# Patient Record
Sex: Female | Born: 1940
Health system: Southern US, Community
[De-identification: ages and names within clinical notes are randomized; demographics above are authoritative.]

## PROBLEM LIST (undated history)

## (undated) DIAGNOSIS — M130 Polyarthritis, unspecified: Secondary | ICD-10-CM

## (undated) DIAGNOSIS — T7840XA Allergy, unspecified, initial encounter: Secondary | ICD-10-CM

## (undated) DIAGNOSIS — M76829 Posterior tibial tendinitis, unspecified leg: Secondary | ICD-10-CM

## (undated) DIAGNOSIS — Z86010 Personal history of colonic polyps: Secondary | ICD-10-CM

## (undated) DIAGNOSIS — R112 Nausea with vomiting, unspecified: Secondary | ICD-10-CM

## (undated) DIAGNOSIS — Z85820 Personal history of malignant melanoma of skin: Secondary | ICD-10-CM

## (undated) DIAGNOSIS — K52832 Lymphocytic colitis: Secondary | ICD-10-CM

## (undated) DIAGNOSIS — M8580 Other specified disorders of bone density and structure, unspecified site: Secondary | ICD-10-CM

## (undated) DIAGNOSIS — G43909 Migraine, unspecified, not intractable, without status migrainosus: Secondary | ICD-10-CM

## (undated) DIAGNOSIS — M5416 Radiculopathy, lumbar region: Secondary | ICD-10-CM

## (undated) DIAGNOSIS — L508 Other urticaria: Secondary | ICD-10-CM

## (undated) DIAGNOSIS — Z9889 Other specified postprocedural states: Secondary | ICD-10-CM

## (undated) DIAGNOSIS — E785 Hyperlipidemia, unspecified: Secondary | ICD-10-CM

## (undated) DIAGNOSIS — J302 Other seasonal allergic rhinitis: Secondary | ICD-10-CM

## (undated) DIAGNOSIS — F419 Anxiety disorder, unspecified: Secondary | ICD-10-CM

## (undated) DIAGNOSIS — R269 Unspecified abnormalities of gait and mobility: Secondary | ICD-10-CM

## (undated) DIAGNOSIS — M1990 Unspecified osteoarthritis, unspecified site: Secondary | ICD-10-CM

## (undated) DIAGNOSIS — K5790 Diverticulosis of intestine, part unspecified, without perforation or abscess without bleeding: Secondary | ICD-10-CM

## (undated) DIAGNOSIS — E669 Obesity, unspecified: Secondary | ICD-10-CM

## (undated) DIAGNOSIS — K760 Fatty (change of) liver, not elsewhere classified: Secondary | ICD-10-CM

## (undated) HISTORY — DX: Other urticaria: L50.8

## (undated) HISTORY — PX: FOOT SURGERY: SHX648

## (undated) HISTORY — DX: Radiculopathy, lumbar region: M54.16

## (undated) HISTORY — DX: Fatty (change of) liver, not elsewhere classified: K76.0

## (undated) HISTORY — DX: Allergy, unspecified, initial encounter: T78.40XA

## (undated) HISTORY — DX: Other specified disorders of bone density and structure, unspecified site: M85.80

## (undated) HISTORY — PX: JOINT REPLACEMENT: SHX530

## (undated) HISTORY — DX: Polyarthritis, unspecified: M13.0

## (undated) HISTORY — DX: Unspecified abnormalities of gait and mobility: R26.9

## (undated) HISTORY — DX: Migraine, unspecified, not intractable, without status migrainosus: G43.909

## (undated) HISTORY — DX: Unspecified osteoarthritis, unspecified site: M19.90

## (undated) HISTORY — DX: Personal history of malignant melanoma of skin: Z85.820

## (undated) HISTORY — DX: Lymphocytic colitis: K52.832

## (undated) HISTORY — DX: Diverticulosis of intestine, part unspecified, without perforation or abscess without bleeding: K57.90

## (undated) HISTORY — DX: Hyperlipidemia, unspecified: E78.5

## (undated) HISTORY — DX: Obesity, unspecified: E66.9

## (undated) HISTORY — DX: Personal history of colonic polyps: Z86.010

---

## 1999-02-25 ENCOUNTER — Ambulatory Visit (HOSPITAL_COMMUNITY): Admission: RE | Admit: 1999-02-25 | Discharge: 1999-02-25 | Payer: Self-pay | Admitting: Sports Medicine

## 1999-02-25 ENCOUNTER — Encounter: Payer: Self-pay | Admitting: Sports Medicine

## 1999-11-24 ENCOUNTER — Encounter: Payer: Self-pay | Admitting: Family Medicine

## 1999-11-24 ENCOUNTER — Encounter: Admission: RE | Admit: 1999-11-24 | Discharge: 1999-11-24 | Payer: Self-pay | Admitting: Family Medicine

## 1999-12-18 ENCOUNTER — Other Ambulatory Visit: Admission: RE | Admit: 1999-12-18 | Discharge: 1999-12-18 | Payer: Self-pay | Admitting: Family Medicine

## 2000-09-27 ENCOUNTER — Encounter: Admission: RE | Admit: 2000-09-27 | Discharge: 2000-09-27 | Payer: Self-pay | Admitting: Family Medicine

## 2000-09-27 ENCOUNTER — Encounter: Payer: Self-pay | Admitting: Family Medicine

## 2000-11-11 ENCOUNTER — Other Ambulatory Visit: Admission: RE | Admit: 2000-11-11 | Discharge: 2000-11-11 | Payer: Self-pay | Admitting: Family Medicine

## 2000-12-31 ENCOUNTER — Encounter: Admission: RE | Admit: 2000-12-31 | Discharge: 2000-12-31 | Payer: Self-pay | Admitting: Family Medicine

## 2000-12-31 ENCOUNTER — Encounter: Payer: Self-pay | Admitting: Family Medicine

## 2002-01-21 ENCOUNTER — Ambulatory Visit (HOSPITAL_COMMUNITY): Admission: RE | Admit: 2002-01-21 | Discharge: 2002-01-21 | Payer: Self-pay | Admitting: Family Medicine

## 2002-01-21 ENCOUNTER — Encounter: Payer: Self-pay | Admitting: Family Medicine

## 2003-07-26 ENCOUNTER — Other Ambulatory Visit: Admission: RE | Admit: 2003-07-26 | Discharge: 2003-07-26 | Payer: Self-pay | Admitting: Family Medicine

## 2003-08-23 ENCOUNTER — Ambulatory Visit (HOSPITAL_COMMUNITY): Admission: RE | Admit: 2003-08-23 | Discharge: 2003-08-23 | Payer: Self-pay | Admitting: Family Medicine

## 2004-08-01 ENCOUNTER — Ambulatory Visit: Payer: Self-pay | Admitting: Cardiology

## 2004-08-03 ENCOUNTER — Ambulatory Visit: Payer: Self-pay

## 2004-12-13 ENCOUNTER — Ambulatory Visit (HOSPITAL_COMMUNITY): Admission: RE | Admit: 2004-12-13 | Discharge: 2004-12-13 | Payer: Self-pay | Admitting: Family Medicine

## 2005-05-03 ENCOUNTER — Other Ambulatory Visit: Admission: RE | Admit: 2005-05-03 | Discharge: 2005-05-03 | Payer: Self-pay | Admitting: Family Medicine

## 2006-01-09 ENCOUNTER — Ambulatory Visit (HOSPITAL_COMMUNITY): Admission: RE | Admit: 2006-01-09 | Discharge: 2006-01-09 | Payer: Self-pay | Admitting: Family Medicine

## 2006-07-31 ENCOUNTER — Other Ambulatory Visit: Admission: RE | Admit: 2006-07-31 | Discharge: 2006-07-31 | Payer: Self-pay | Admitting: Family Medicine

## 2007-01-13 ENCOUNTER — Ambulatory Visit (HOSPITAL_COMMUNITY): Admission: RE | Admit: 2007-01-13 | Discharge: 2007-01-13 | Payer: Self-pay | Admitting: Family Medicine

## 2007-10-13 ENCOUNTER — Other Ambulatory Visit: Admission: RE | Admit: 2007-10-13 | Discharge: 2007-10-13 | Payer: Self-pay | Admitting: Family Medicine

## 2008-01-23 ENCOUNTER — Ambulatory Visit (HOSPITAL_COMMUNITY): Admission: RE | Admit: 2008-01-23 | Discharge: 2008-01-23 | Payer: Self-pay | Admitting: Family Medicine

## 2008-10-28 DIAGNOSIS — K5790 Diverticulosis of intestine, part unspecified, without perforation or abscess without bleeding: Secondary | ICD-10-CM | POA: Insufficient documentation

## 2008-10-28 HISTORY — PX: COLONOSCOPY: SHX174

## 2008-10-28 HISTORY — DX: Diverticulosis of intestine, part unspecified, without perforation or abscess without bleeding: K57.90

## 2009-03-11 ENCOUNTER — Ambulatory Visit (HOSPITAL_COMMUNITY): Admission: RE | Admit: 2009-03-11 | Discharge: 2009-03-11 | Payer: Self-pay | Admitting: Family Medicine

## 2010-03-15 ENCOUNTER — Ambulatory Visit (HOSPITAL_COMMUNITY)
Admission: RE | Admit: 2010-03-15 | Discharge: 2010-03-15 | Payer: Self-pay | Source: Home / Self Care | Attending: Family Medicine | Admitting: Family Medicine

## 2011-03-08 DIAGNOSIS — M1710 Unilateral primary osteoarthritis, unspecified knee: Secondary | ICD-10-CM | POA: Diagnosis not present

## 2011-03-08 DIAGNOSIS — IMO0002 Reserved for concepts with insufficient information to code with codable children: Secondary | ICD-10-CM | POA: Diagnosis not present

## 2011-03-13 DIAGNOSIS — IMO0002 Reserved for concepts with insufficient information to code with codable children: Secondary | ICD-10-CM | POA: Diagnosis not present

## 2011-03-13 DIAGNOSIS — M1710 Unilateral primary osteoarthritis, unspecified knee: Secondary | ICD-10-CM | POA: Diagnosis not present

## 2011-03-20 DIAGNOSIS — M2010 Hallux valgus (acquired), unspecified foot: Secondary | ICD-10-CM | POA: Diagnosis not present

## 2011-04-02 ENCOUNTER — Other Ambulatory Visit (HOSPITAL_COMMUNITY): Payer: Self-pay | Admitting: Family Medicine

## 2011-04-02 DIAGNOSIS — Z1231 Encounter for screening mammogram for malignant neoplasm of breast: Secondary | ICD-10-CM

## 2011-04-19 DIAGNOSIS — IMO0002 Reserved for concepts with insufficient information to code with codable children: Secondary | ICD-10-CM | POA: Diagnosis not present

## 2011-04-19 DIAGNOSIS — M1710 Unilateral primary osteoarthritis, unspecified knee: Secondary | ICD-10-CM | POA: Diagnosis not present

## 2011-04-27 ENCOUNTER — Ambulatory Visit (HOSPITAL_COMMUNITY)
Admission: RE | Admit: 2011-04-27 | Discharge: 2011-04-27 | Disposition: A | Discharge status: Home / Self Care | Payer: Medicare Other | Source: Ambulatory Visit | Attending: Family Medicine | Admitting: Family Medicine

## 2011-04-27 DIAGNOSIS — Z1231 Encounter for screening mammogram for malignant neoplasm of breast: Secondary | ICD-10-CM

## 2011-05-03 ENCOUNTER — Encounter (HOSPITAL_COMMUNITY): Payer: Self-pay | Admitting: Pharmacy Technician

## 2011-05-04 ENCOUNTER — Encounter (HOSPITAL_COMMUNITY)
Admission: RE | Admit: 2011-05-04 | Discharge: 2011-05-04 | Disposition: A | Discharge status: Home / Self Care | Payer: Medicare Other | Source: Ambulatory Visit | Attending: Orthopedic Surgery | Admitting: Orthopedic Surgery

## 2011-05-04 ENCOUNTER — Encounter (HOSPITAL_COMMUNITY): Payer: Self-pay

## 2011-05-04 HISTORY — DX: Other seasonal allergic rhinitis: J30.2

## 2011-05-04 LAB — URINALYSIS, ROUTINE W REFLEX MICROSCOPIC
Glucose, UA: NEGATIVE mg/dL
Hgb urine dipstick: NEGATIVE
Specific Gravity, Urine: 1.013 (ref 1.005–1.030)
Urobilinogen, UA: 0.2 mg/dL (ref 0.0–1.0)

## 2011-05-04 LAB — DIFFERENTIAL
Eosinophils Relative: 2 % (ref 0–5)
Lymphocytes Relative: 36 % (ref 12–46)
Lymphs Abs: 2.7 10*3/uL (ref 0.7–4.0)
Monocytes Absolute: 0.7 10*3/uL (ref 0.1–1.0)

## 2011-05-04 LAB — BASIC METABOLIC PANEL
CO2: 29 mEq/L (ref 19–32)
Chloride: 102 mEq/L (ref 96–112)
Sodium: 138 mEq/L (ref 135–145)

## 2011-05-04 LAB — CBC
HCT: 39.4 % (ref 36.0–46.0)
Hemoglobin: 13.1 g/dL (ref 12.0–15.0)
MCV: 91 fL (ref 78.0–100.0)
RBC: 4.33 MIL/uL (ref 3.87–5.11)
WBC: 7.5 10*3/uL (ref 4.0–10.5)

## 2011-05-04 LAB — APTT: aPTT: 32 seconds (ref 24–37)

## 2011-05-04 LAB — SURGICAL PCR SCREEN
MRSA, PCR: NEGATIVE
Staphylococcus aureus: NEGATIVE

## 2011-05-04 LAB — PROTIME-INR: Prothrombin Time: 12.2 seconds (ref 11.6–15.2)

## 2011-05-04 MED ORDER — CHLORHEXIDINE GLUCONATE 4 % EX LIQD: 60.0000 mL | Freq: Once | CUTANEOUS | Status: DC

## 2011-05-04 NOTE — Patient Instructions (Addendum)
21 Courtney Lawrence  05/04/2011   Your procedure is scheduled on:  05/14/11  Report to SHORT STAY DEPT  at 7:30 AM.  Call this number if you have problems the morning of surgery: [phone]   Remember:   Do not eat food or drink liquids AFTER MIDNIGHT  Take these medicines the morning of surgery with A SIP OF WATER: none   Do not wear jewelry, make-up or nail polish.  Do not wear lotions, powders, or perfumes.   Do not shave legs or underarms 48 hrs. before surgery (men may shave face)  Do not bring valuables to the hospital.  Contacts, dentures or bridgework may not be worn into surgery.  Leave suitcase in the car. After surgery it may be brought to your room.  For patients admitted to the hospital, checkout time is 11:00 AM the day of discharge.   Patients discharged the day of surgery will not be allowed to drive home.  Name and phone number of your driver:   Special Instructions:   Please read over the following fact sheets that you were given: MRSA  Information / Incentive Spirometer Instructions               SHOWER WITH BETASEPT THE NIGHT BEFORE SURGERY AND THE MORNING OF SURGERY

## 2011-05-10 NOTE — H&P (Signed)
Courtney Lawrence is an 71 y.o. female.    Chief Complaint: left knee OA and pain   HPI: Pt is a 71 y.o. female complaining of left knee pain for years off and on. Pain had continually increased since the beginning. The pain really increased after having surgery on her right foot, causing her to stress the left knee. X-rays in the clinic show end-stage arthritic changes of the left knee. Pt has tried various conservative treatments which have failed to alleviate their symptoms including NSAIDs and steroid injections. Various options are discussed with the patient. Risks, benefits and expectations were discussed with the patient. Patient understand the risks, benefits and expectations and wishes to proceed with surgery.   PCP:  Astrid Divine, MD, MD  D/C Plans:  Home with HHP  Post-op Meds:  Rx given for Xarelto, Robaxin, Iron, Colace and MiraLax. Pt is allergic to ASA and will take Xarelto for 14 days.  Tranexamic Acid:  To be given  Decadron:  To be given  PMH: Past Medical History  Diagnosis Date  . Seasonal allergies   . Headache     MIGRAINES    PSH: Past Surgical History  Procedure Date  . Foot surgery 2012 ALSO 2003    Social History:  reports that she has never smoked. She does not have any smokeless tobacco history on file. She reports that she drinks alcohol. She reports that she does not use illicit drugs.  Allergies:  Allergies  Allergen Reactions  . Aspirin Hives and Other (See Comments)    Headache   . Yellow Dyes (Non-Tartrazine) Hives  . Nickel Rash    GETS "INFECTION" WITH NICKEL EARRINGS    Medications: No current facility-administered medications for this encounter.   Current Outpatient Prescriptions  Medication Sig Dispense Refill  . CALCIUM-MAGNESIUM-VITAMIN D PO Take 1 tablet by mouth 2 (two) times daily.      . fish oil-omega-3 fatty acids 1000 MG capsule Take 1 g by mouth daily.      Marland Kitchen glucosamine-chondroitin 500-400 MG tablet Take 1  tablet by mouth 2 (two) times daily.      . Multiple Vitamins-Minerals (ICAPS PO) Take 1 tablet by mouth daily.      . SUMAtriptan (IMITREX) 100 MG tablet Take 50 mg by mouth every 2 (two) hours as needed. Headache        ROS: Review of Systems  Constitutional: Negative.   HENT: Negative.   Eyes: Negative.   Respiratory: Negative.   Cardiovascular: Negative.   Gastrointestinal: Negative.   Genitourinary: Negative.   Musculoskeletal: Positive for joint pain.  Skin: Negative.   Neurological: Negative.   Endo/Heme/Allergies: Positive for environmental allergies.  Psychiatric/Behavioral: Negative.      Physican Exam: BP: 126/73 ; HR: 80 ; Resp: 16 Physical Exam  Constitutional: She is oriented to person, place, and time and well-developed, well-nourished, and in no distress.  HENT:  Head: Normocephalic and atraumatic.  Nose: Nose normal.  Mouth/Throat: Oropharynx is clear and moist.  Eyes: Pupils are equal, round, and reactive to light.  Neck: Neck supple. No JVD present. No tracheal deviation present. No thyromegaly present.  Cardiovascular: Normal rate, regular rhythm, normal heart sounds and intact distal pulses.   Pulmonary/Chest: Effort normal and breath sounds normal. No stridor.  Abdominal: Soft. There is no tenderness. There is no guarding.  Musculoskeletal:       Left knee: She exhibits decreased range of motion (5-120) and bony tenderness. She exhibits no swelling, no effusion and  no ecchymosis. tenderness found.  Lymphadenopathy:    She has no cervical adenopathy.  Neurological: She is alert and oriented to person, place, and time.  Skin: Skin is warm and dry.  Psychiatric: Affect normal.     Assessment/Plan Assessment: left knee OA and pain   Plan: Patient will undergo a left total knee arthroplasty on 05/14/2011 at [facility] per Dr. Charlann Boxer. Risks benefits and expectation were discussed with the patient. Patient understand risks, benefits and  expectation and wishes to proceed.   Anastasio Auerbach Abdishakur Gottschall   PAC  05/10/2011, 3:57 PM

## 2011-05-13 MED ORDER — TRANEXAMIC ACID 100 MG/ML IV SOLN
1100.0000 mg | Freq: Once | INTRAVENOUS | Status: DC
  Filled 2011-05-13: qty 11

## 2011-05-14 ENCOUNTER — Encounter (HOSPITAL_COMMUNITY): Payer: Self-pay | Admitting: Anesthesiology

## 2011-05-14 ENCOUNTER — Encounter (HOSPITAL_COMMUNITY): Payer: Self-pay | Admitting: *Deleted

## 2011-05-14 ENCOUNTER — Inpatient Hospital Stay (HOSPITAL_COMMUNITY)
Admission: RE | Admit: 2011-05-14 | Discharge: 2011-05-17 | DRG: 470 | Disposition: A | Discharge status: Skilled Nursing Facility | Payer: Medicare Other | Source: Ambulatory Visit | Attending: Orthopedic Surgery | Admitting: Orthopedic Surgery

## 2011-05-14 ENCOUNTER — Ambulatory Visit (HOSPITAL_COMMUNITY): Payer: Medicare Other | Admitting: Anesthesiology

## 2011-05-14 ENCOUNTER — Encounter (HOSPITAL_COMMUNITY)
Admission: RE | Disposition: A | Discharge status: Skilled Nursing Facility | Payer: Self-pay | Source: Ambulatory Visit | Attending: Orthopedic Surgery

## 2011-05-14 DIAGNOSIS — M1710 Unilateral primary osteoarthritis, unspecified knee: Secondary | ICD-10-CM | POA: Diagnosis not present

## 2011-05-14 DIAGNOSIS — Z01812 Encounter for preprocedural laboratory examination: Secondary | ICD-10-CM | POA: Diagnosis not present

## 2011-05-14 DIAGNOSIS — D649 Anemia, unspecified: Secondary | ICD-10-CM | POA: Diagnosis not present

## 2011-05-14 DIAGNOSIS — M25569 Pain in unspecified knee: Secondary | ICD-10-CM | POA: Diagnosis not present

## 2011-05-14 DIAGNOSIS — K5900 Constipation, unspecified: Secondary | ICD-10-CM | POA: Diagnosis not present

## 2011-05-14 DIAGNOSIS — Z96659 Presence of unspecified artificial knee joint: Secondary | ICD-10-CM | POA: Diagnosis not present

## 2011-05-14 DIAGNOSIS — M1990 Unspecified osteoarthritis, unspecified site: Secondary | ICD-10-CM | POA: Diagnosis not present

## 2011-05-14 DIAGNOSIS — Z5189 Encounter for other specified aftercare: Secondary | ICD-10-CM | POA: Diagnosis not present

## 2011-05-14 DIAGNOSIS — IMO0002 Reserved for concepts with insufficient information to code with codable children: Secondary | ICD-10-CM | POA: Diagnosis not present

## 2011-05-14 DIAGNOSIS — M129 Arthropathy, unspecified: Secondary | ICD-10-CM | POA: Diagnosis not present

## 2011-05-14 HISTORY — PX: TOTAL KNEE ARTHROPLASTY: SHX125

## 2011-05-14 LAB — TYPE AND SCREEN
ABO/RH(D): O POS
Antibody Screen: NEGATIVE

## 2011-05-14 SURGERY — ARTHROPLASTY, KNEE, TOTAL
Anesthesia: Spinal | Site: Knee | Laterality: Left | Wound class: Clean

## 2011-05-14 MED ORDER — METOCLOPRAMIDE HCL 5 MG/ML IJ SOLN
5.0000 mg | Freq: Three times a day (TID) | INTRAMUSCULAR | Status: DC | PRN
  Administered 2011-05-14 – 2011-05-16 (×2): 10 mg via INTRAVENOUS
  Filled 2011-05-14 (×2): qty 2

## 2011-05-14 MED ORDER — DEXAMETHASONE SODIUM PHOSPHATE 10 MG/ML IJ SOLN
10.0000 mg | Freq: Once | INTRAMUSCULAR | Status: AC
  Administered 2011-05-15: 10 mg via INTRAVENOUS
  Filled 2011-05-14: qty 1

## 2011-05-14 MED ORDER — CEFAZOLIN SODIUM 1-5 GM-% IV SOLN
1.0000 g | INTRAVENOUS | Status: AC
  Administered 2011-05-14: 1 g via INTRAVENOUS

## 2011-05-14 MED ORDER — DIPHENHYDRAMINE HCL 25 MG PO CAPS: 25.0000 mg | ORAL_CAPSULE | Freq: Four times a day (QID) | ORAL | Status: DC | PRN

## 2011-05-14 MED ORDER — ACETAMINOPHEN 650 MG RE SUPP: 650.0000 mg | Freq: Four times a day (QID) | RECTAL | Status: DC | PRN

## 2011-05-14 MED ORDER — HYDROMORPHONE HCL PF 1 MG/ML IJ SOLN
0.2500 mg | INTRAMUSCULAR | Status: DC | PRN
  Administered 2011-05-14 (×2): 0.5 mg via INTRAVENOUS

## 2011-05-14 MED ORDER — METHOCARBAMOL 500 MG PO TABS
500.0000 mg | ORAL_TABLET | Freq: Four times a day (QID) | ORAL | Status: DC | PRN
  Administered 2011-05-15 – 2011-05-17 (×5): 500 mg via ORAL
  Filled 2011-05-14 (×5): qty 1

## 2011-05-14 MED ORDER — DEXAMETHASONE SODIUM PHOSPHATE 10 MG/ML IJ SOLN
INTRAMUSCULAR | Status: DC | PRN
  Administered 2011-05-14: 10 mg via INTRAVENOUS

## 2011-05-14 MED ORDER — KETOROLAC TROMETHAMINE 30 MG/ML IJ SOLN
INTRAMUSCULAR | Status: AC
  Filled 2011-05-14: qty 1

## 2011-05-14 MED ORDER — ALUM & MAG HYDROXIDE-SIMETH 200-200-20 MG/5ML PO SUSP: 30.0000 mL | ORAL | Status: DC | PRN

## 2011-05-14 MED ORDER — HYDROMORPHONE HCL PF 1 MG/ML IJ SOLN
0.5000 mg | INTRAMUSCULAR | Status: DC | PRN
  Administered 2011-05-14: 1 mg via INTRAVENOUS
  Administered 2011-05-14 – 2011-05-15 (×3): 2 mg via INTRAVENOUS
  Filled 2011-05-14 (×3): qty 2

## 2011-05-14 MED ORDER — 0.9 % SODIUM CHLORIDE (POUR BTL) OPTIME
TOPICAL | Status: DC | PRN
  Administered 2011-05-14: 1000 mL

## 2011-05-14 MED ORDER — ACETAMINOPHEN 325 MG PO TABS: 650.0000 mg | ORAL_TABLET | Freq: Four times a day (QID) | ORAL | Status: DC | PRN

## 2011-05-14 MED ORDER — DEXAMETHASONE SODIUM PHOSPHATE 10 MG/ML IJ SOLN: 10.0000 mg | Freq: Once | INTRAMUSCULAR | Status: DC

## 2011-05-14 MED ORDER — PROPOFOL 10 MG/ML IV EMUL
INTRAVENOUS | Status: DC | PRN
  Administered 2011-05-14: 120 ug/kg/min via INTRAVENOUS

## 2011-05-14 MED ORDER — BUPIVACAINE IN DEXTROSE 0.75-8.25 % IT SOLN
INTRATHECAL | Status: DC | PRN
  Administered 2011-05-14: 10 mg via INTRATHECAL

## 2011-05-14 MED ORDER — BUPIVACAINE-EPINEPHRINE PF 0.25-1:200000 % IJ SOLN
INTRAMUSCULAR | Status: AC
  Filled 2011-05-14: qty 60

## 2011-05-14 MED ORDER — ZOLPIDEM TARTRATE 5 MG PO TABS: 5.0000 mg | ORAL_TABLET | Freq: Every evening | ORAL | Status: DC | PRN

## 2011-05-14 MED ORDER — DOCUSATE SODIUM 100 MG PO CAPS: 100.0000 mg | ORAL_CAPSULE | Freq: Two times a day (BID) | ORAL | Status: DC

## 2011-05-14 MED ORDER — RIVAROXABAN 10 MG PO TABS
10.0000 mg | ORAL_TABLET | ORAL | Status: DC
  Administered 2011-05-15 – 2011-05-17 (×3): 10 mg via ORAL
  Filled 2011-05-14 (×4): qty 1

## 2011-05-14 MED ORDER — FLEET ENEMA 7-19 GM/118ML RE ENEM: 1.0000 | ENEMA | Freq: Once | RECTAL | Status: AC | PRN

## 2011-05-14 MED ORDER — MIDAZOLAM HCL 5 MG/5ML IJ SOLN
INTRAMUSCULAR | Status: DC | PRN
  Administered 2011-05-14: 2 mg via INTRAVENOUS
  Administered 2011-05-14 (×2): 1 mg via INTRAVENOUS

## 2011-05-14 MED ORDER — TRANEXAMIC ACID 100 MG/ML IV SOLN
1100.0000 mg | INTRAVENOUS | Status: DC | PRN
  Administered 2011-05-14: 1100 mg via INTRAVENOUS

## 2011-05-14 MED ORDER — ACETAMINOPHEN 10 MG/ML IV SOLN
INTRAVENOUS | Status: AC
  Filled 2011-05-14: qty 100

## 2011-05-14 MED ORDER — HYDROCODONE-ACETAMINOPHEN 7.5-325 MG PO TABS
1.0000 | ORAL_TABLET | ORAL | Status: DC
  Administered 2011-05-14 – 2011-05-15 (×6): 2 via ORAL
  Administered 2011-05-16: 1 via ORAL
  Administered 2011-05-16 (×3): 2 via ORAL
  Administered 2011-05-16 – 2011-05-17 (×4): 1 via ORAL
  Filled 2011-05-14: qty 2
  Filled 2011-05-14: qty 1
  Filled 2011-05-14: qty 2
  Filled 2011-05-14: qty 1
  Filled 2011-05-14 (×3): qty 2
  Filled 2011-05-14: qty 1
  Filled 2011-05-14: qty 2
  Filled 2011-05-14: qty 1
  Filled 2011-05-14: qty 2
  Filled 2011-05-14: qty 1
  Filled 2011-05-14 (×3): qty 2

## 2011-05-14 MED ORDER — POLYETHYLENE GLYCOL 3350 17 G PO PACK
17.0000 g | PACK | Freq: Two times a day (BID) | ORAL | Status: DC
  Administered 2011-05-15 – 2011-05-17 (×4): 17 g via ORAL
  Filled 2011-05-14 (×5): qty 1

## 2011-05-14 MED ORDER — SODIUM CHLORIDE 0.9 % IV SOLN
INTRAVENOUS | Status: DC
  Administered 2011-05-14 – 2011-05-17 (×5): via INTRAVENOUS
  Filled 2011-05-14 (×13): qty 1000

## 2011-05-14 MED ORDER — MENTHOL 3 MG MT LOZG: 1.0000 | LOZENGE | OROMUCOSAL | Status: DC | PRN

## 2011-05-14 MED ORDER — SUMATRIPTAN SUCCINATE 50 MG PO TABS
50.0000 mg | ORAL_TABLET | ORAL | Status: DC | PRN
  Filled 2011-05-14 (×3): qty 1

## 2011-05-14 MED ORDER — LACTATED RINGERS IV SOLN
INTRAVENOUS | Status: DC
  Administered 2011-05-14: 1000 mL via INTRAVENOUS

## 2011-05-14 MED ORDER — FERROUS SULFATE 325 (65 FE) MG PO TABS
325.0000 mg | ORAL_TABLET | Freq: Three times a day (TID) | ORAL | Status: DC
  Administered 2011-05-15 – 2011-05-17 (×8): 325 mg via ORAL
  Filled 2011-05-14 (×7): qty 1

## 2011-05-14 MED ORDER — LACTATED RINGERS IV SOLN
INTRAVENOUS | Status: DC | PRN
  Administered 2011-05-14 (×3): via INTRAVENOUS

## 2011-05-14 MED ORDER — ONDANSETRON HCL 4 MG PO TABS
4.0000 mg | ORAL_TABLET | Freq: Four times a day (QID) | ORAL | Status: DC | PRN
  Administered 2011-05-16 (×2): 4 mg via ORAL
  Filled 2011-05-14 (×2): qty 1

## 2011-05-14 MED ORDER — BISACODYL 5 MG PO TBEC: 5.0000 mg | DELAYED_RELEASE_TABLET | Freq: Every day | ORAL | Status: DC | PRN

## 2011-05-14 MED ORDER — PHENOL 1.4 % MT LIQD: 1.0000 | OROMUCOSAL | Status: DC | PRN

## 2011-05-14 MED ORDER — SODIUM CHLORIDE 0.9 % IR SOLN
Status: DC | PRN
  Administered 2011-05-14: 3000 mL

## 2011-05-14 MED ORDER — HYDROMORPHONE HCL PF 1 MG/ML IJ SOLN
INTRAMUSCULAR | Status: AC
  Administered 2011-05-15: 1 mg via INTRAVENOUS
  Filled 2011-05-14: qty 1

## 2011-05-14 MED ORDER — HYDROMORPHONE HCL PF 1 MG/ML IJ SOLN
INTRAMUSCULAR | Status: AC
  Filled 2011-05-14: qty 1

## 2011-05-14 MED ORDER — ONDANSETRON HCL 4 MG/2ML IJ SOLN
4.0000 mg | Freq: Four times a day (QID) | INTRAMUSCULAR | Status: DC | PRN
  Administered 2011-05-14 – 2011-05-15 (×5): 4 mg via INTRAVENOUS
  Filled 2011-05-14 (×5): qty 2

## 2011-05-14 MED ORDER — ONDANSETRON HCL 4 MG/2ML IJ SOLN
INTRAMUSCULAR | Status: DC | PRN
  Administered 2011-05-14 (×2): 2 mg via INTRAVENOUS

## 2011-05-14 MED ORDER — CEFAZOLIN SODIUM 1-5 GM-% IV SOLN
1.0000 g | Freq: Four times a day (QID) | INTRAVENOUS | Status: AC
  Administered 2011-05-15 (×2): 1 g via INTRAVENOUS
  Filled 2011-05-14 (×3): qty 50

## 2011-05-14 MED ORDER — ACETAMINOPHEN 10 MG/ML IV SOLN
INTRAVENOUS | Status: DC | PRN
  Administered 2011-05-14: 1000 mg via INTRAVENOUS

## 2011-05-14 MED ORDER — CEFAZOLIN SODIUM 1-5 GM-% IV SOLN
INTRAVENOUS | Status: AC
  Filled 2011-05-14: qty 50

## 2011-05-14 MED ORDER — METHOCARBAMOL 100 MG/ML IJ SOLN
500.0000 mg | Freq: Four times a day (QID) | INTRAVENOUS | Status: DC | PRN
  Administered 2011-05-14: 500 mg via INTRAVENOUS
  Filled 2011-05-14 (×2): qty 5

## 2011-05-14 MED ORDER — BUPIVACAINE-EPINEPHRINE PF 0.25-1:200000 % IJ SOLN
INTRAMUSCULAR | Status: DC | PRN
  Administered 2011-05-14: 60 mL

## 2011-05-14 MED ORDER — METOCLOPRAMIDE HCL 10 MG PO TABS
5.0000 mg | ORAL_TABLET | Freq: Three times a day (TID) | ORAL | Status: DC | PRN
  Administered 2011-05-16: 10 mg via ORAL
  Filled 2011-05-14: qty 1

## 2011-05-14 SURGICAL SUPPLY — 58 items
ADH SKN CLS APL DERMABOND .7 (GAUZE/BANDAGES/DRESSINGS) ×1
BAG SPEC THK2 15X12 ZIP CLS (MISCELLANEOUS) ×1
BAG ZIPLOCK 12X15 (MISCELLANEOUS) ×2 IMPLANT
BANDAGE ELASTIC 6 VELCRO ST LF (GAUZE/BANDAGES/DRESSINGS) ×2 IMPLANT
BANDAGE ESMARK 6X9 LF (GAUZE/BANDAGES/DRESSINGS) ×1 IMPLANT
BLADE SAW SGTL 13.0X1.19X90.0M (BLADE) ×2 IMPLANT
BNDG CMPR 9X6 STRL LF SNTH (GAUZE/BANDAGES/DRESSINGS) ×1
BNDG ESMARK 6X9 LF (GAUZE/BANDAGES/DRESSINGS) ×2
BOWL SMART MIX CTS (DISPOSABLE) ×2 IMPLANT
CEMENT HV SMART SET (Cement) ×2 IMPLANT
CLOTH BEACON ORANGE TIMEOUT ST (SAFETY) ×2 IMPLANT
CUFF TOURN SGL QUICK 34 (TOURNIQUET CUFF) ×2
CUFF TRNQT CYL 34X4X40X1 (TOURNIQUET CUFF) ×1 IMPLANT
DECANTER SPIKE VIAL GLASS SM (MISCELLANEOUS) ×1 IMPLANT
DERMABOND ADVANCED (GAUZE/BANDAGES/DRESSINGS) ×1
DERMABOND ADVANCED .7 DNX12 (GAUZE/BANDAGES/DRESSINGS) ×1 IMPLANT
DRAPE EXTREMITY T 121X128X90 (DRAPE) ×2 IMPLANT
DRAPE POUCH INSTRU U-SHP 10X18 (DRAPES) ×2 IMPLANT
DRAPE U-SHAPE 47X51 STRL (DRAPES) ×2 IMPLANT
DRSG AQUACEL AG ADV 3.5X10 (GAUZE/BANDAGES/DRESSINGS) ×2 IMPLANT
DRSG TEGADERM 4X4.75 (GAUZE/BANDAGES/DRESSINGS) ×2 IMPLANT
DURAPREP 26ML APPLICATOR (WOUND CARE) ×2 IMPLANT
ELECT REM PT RETURN 9FT ADLT (ELECTROSURGICAL) ×2
ELECTRODE REM PT RTRN 9FT ADLT (ELECTROSURGICAL) ×1 IMPLANT
EVACUATOR 1/8 PVC DRAIN (DRAIN) ×2 IMPLANT
FACESHIELD LNG OPTICON STERILE (SAFETY) ×10 IMPLANT
GAUZE SPONGE 2X2 8PLY STRL LF (GAUZE/BANDAGES/DRESSINGS) ×1 IMPLANT
GLOVE BIOGEL PI IND STRL 7.5 (GLOVE) ×1 IMPLANT
GLOVE BIOGEL PI IND STRL 8 (GLOVE) ×1 IMPLANT
GLOVE BIOGEL PI INDICATOR 7.5 (GLOVE) ×1
GLOVE BIOGEL PI INDICATOR 8 (GLOVE) ×1
GLOVE ECLIPSE 8.0 STRL XLNG CF (GLOVE) ×2 IMPLANT
GLOVE ORTHO TXT STRL SZ7.5 (GLOVE) ×4 IMPLANT
GOWN BRE IMP PREV XXLGXLNG (GOWN DISPOSABLE) ×2 IMPLANT
GOWN STRL NON-REIN LRG LVL3 (GOWN DISPOSABLE) ×2 IMPLANT
HANDPIECE INTERPULSE COAX TIP (DISPOSABLE) ×2
IMMOBILIZER KNEE 20 (SOFTGOODS) ×2
IMMOBILIZER KNEE 20 THIGH 36 (SOFTGOODS) IMPLANT
KIT BASIN OR (CUSTOM PROCEDURE TRAY) ×2 IMPLANT
MANIFOLD NEPTUNE II (INSTRUMENTS) ×2 IMPLANT
NDL SAFETY ECLIPSE 18X1.5 (NEEDLE) ×1 IMPLANT
NEEDLE HYPO 18GX1.5 SHARP (NEEDLE) ×2
NS IRRIG 1000ML POUR BTL (IV SOLUTION) ×4 IMPLANT
PACK TOTAL JOINT (CUSTOM PROCEDURE TRAY) ×2 IMPLANT
POSITIONER SURGICAL ARM (MISCELLANEOUS) ×2 IMPLANT
SET HNDPC FAN SPRY TIP SCT (DISPOSABLE) ×1 IMPLANT
SET PAD KNEE POSITIONER (MISCELLANEOUS) ×2 IMPLANT
SPONGE GAUZE 2X2 STER 10/PKG (GAUZE/BANDAGES/DRESSINGS) ×1
SUCTION FRAZIER 12FR DISP (SUCTIONS) ×2 IMPLANT
SUT MNCRL AB 4-0 PS2 18 (SUTURE) ×2 IMPLANT
SUT VIC AB 1 CT1 36 (SUTURE) ×5 IMPLANT
SUT VIC AB 2-0 CT1 27 (SUTURE) ×4
SUT VIC AB 2-0 CT1 TAPERPNT 27 (SUTURE) ×3 IMPLANT
SYR 50ML LL SCALE MARK (SYRINGE) ×2 IMPLANT
TOWEL OR 17X26 10 PK STRL BLUE (TOWEL DISPOSABLE) ×4 IMPLANT
TRAY FOLEY CATH 14FRSI W/METER (CATHETERS) ×2 IMPLANT
WATER STERILE IRR 1500ML POUR (IV SOLUTION) ×2 IMPLANT
WRAP KNEE MAXI GEL POST OP (GAUZE/BANDAGES/DRESSINGS) ×2 IMPLANT

## 2011-05-14 NOTE — Anesthesia Postprocedure Evaluation (Signed)
  Anesthesia Post-op Note  Patient: Courtney Lawrence  Procedure(s) Performed: Procedure(s) (LRB): TOTAL KNEE ARTHROPLASTY (Left)  Patient Location: PACU  Anesthesia Type: Spinal  Level of Consciousness: oriented and sedated  Airway and Oxygen Therapy: Patient Spontanous Breathing and Patient connected to nasal cannula oxygen  Post-op Pain: mild  Post-op Assessment: Post-op Vital signs reviewed, Patient's Cardiovascular Status Stable, Respiratory Function Stable and Patent Airway  Post-op Vital Signs: stable  Complications: No apparent anesthesia complications

## 2011-05-14 NOTE — Op Note (Signed)
NAME:  Courtney Lawrence                      MEDICAL RECORD NO.:  [id number]                             FACILITY:  [facility]      PHYSICIAN:  Madlyn Frankel. Charlann Boxer, M.D.  DATE OF BIRTH:  [birth year]      DATE OF PROCEDURE:  05/14/2011                                     OPERATIVE REPORT         PREOPERATIVE DIAGNOSIS:  Left knee osteoarthritis. With Nickel allergy, known     POSTOPERATIVE DIAGNOSIS:  Left knee osteoarthritis. With known Nickel allergy     FINDINGS:  The patient was noted to have complete loss of cartilage and   bone-on-bone arthritis with associated osteophytes in the medial and patellofemoral compartments of   the knee with a significant synovitis and associated effusion.      PROCEDURE:  Left total knee replacement.      COMPONENTS USED:  Smith and Nephew posterior stabilized knee   system, a size 5 Oxinium femur, 3 titanium baseplate tibia, 11mm high flex insert, and 35/7.32mm patellar   button.      SURGEON:  Madlyn Frankel. Charlann Boxer, M.D.      ASSISTANT:  Lanney Gins, PA-C.      ANESTHESIA:  Spinal.      SPECIMENS:  None.      COMPLICATION:  None.      DRAINS:  One Hemovac.  EBL: <100cc      TOURNIQUET TIME:   Total Tourniquet Time Documented: Thigh (Left) - 48 minutes .      The patient was stable to the recovery room.      INDICATION FOR PROCEDURE:  Courtney Lawrence is a 71 y.o. female patient of   mine.  The patient had been seen, evaluated, and treated conservatively in the   office with medication, activity modification, and injections.  The patient had   radiographic changes of bone-on-bone arthritis with endplate sclerosis and osteophytes noted.      The patient failed conservative measures including medication, injections, and activity modification, and at this point was ready for more definitive measures.   Based on the radiographic changes and failed conservative measures, the patient   decided to proceed with total knee replacement.  Risks of infection,     DVT, component failure, need for revision surgery, postop course, and   expectations were all   discussed and reviewed.  Consent was obtained for benefit of pain   relief.      PROCEDURE IN DETAIL:  The patient was brought to the operative theater.   Once adequate anesthesia, preoperative antibiotics, 1 gm of Ancef administered, the patient was positioned supine with the left thigh tourniquet placed.  The  left lower extremity was prepped and draped in sterile fashion.  A time-   out was performed identifying the patient, planned procedure, and   extremity.      The left lower extremity was placed in the [facility] leg holder.  The leg was   exsanguinated, tourniquet elevated to 250 mmHg.  A midline incision was   made followed by median parapatellar arthrotomy.  Following initial  exposure, attention was first directed to the patella.  Precut   measurement was noted to be 23 mm.  I resected down to 14 mm and used a   35/7.53mm patellar button to restore patellar height as well as cover the cut   surface.      The lug holes were drilled and a metal shim was placed to protect the   patella from retractors and saw blades.      At this point, attention was now directed to the femur.  The femoral   canal was opened with a drill, irrigated to try to prevent fat emboli.  An   intramedullary rod was passed at 5 degrees valgus, 9.5 mm of bone was   resected off the distal femur.  Following this resection, the tibia was   subluxated anteriorly.  Using the extramedullary guide, 9 mm of bone was resected off   the proximal lateral tibia.  We confirmed the gap would be   stable medially and laterally with a 10 mm insert as well as confirmed   the cut was perpendicular in the coronal plane, checking with an alignment rod.      Once this was done, I sized the femur to be a size 5 in the anterior-   posterior dimension.  The   anterior, posterior, and  chamfer cuts were made without difficulty nor    notching making certain that I was along the anterior cortex to help   with flexion gap stability.      The final box cut was made off the lateral aspect of distal femur.      At this point, the tibia was sized to be a size 3, the size 3 tray was   then pinned in position through the medial third of the tubercle,   drilled, and keel punched.  Trial reduction was now carried with a 5 femur,  3 tibia, a 11 mm insert, and the 35 patella botton.  The knee was brought to   extension, full extension with good flexion stability with the patella   tracking through the trochlea without application of pressure.  Given   all these findings, the trial components removed.  Final components were   opened and cement was mixed.  The knee was irrigated with normal saline   solution and pulse lavage.  The synovial lining was   then injected with 0.25% Marcaine with epinephrine and 1 cc of Toradol,   total of 61 cc.      The knee was irrigated.  Final implants were then cemented onto clean and   dried cut surfaces of bone with the knee brought to extension with a 11   mm trial insert.      Once the cement had fully cured, the excess cement was removed   throughout the knee.  I confirmed I was satisfied with the range of   motion and stability, and the final 11mm high flex posterior stabilized insert was chosen.  It was   placed into the knee.      The tourniquet had been let down at 48 minutes.  No significant   hemostasis required.  The medium Hemovac drain was placed deep.  The   extensor mechanism was then reapproximated using #1 Vicryl with the knee   in flexion.  The   remaining wound was closed with 2-0 Vicryl and running 4-0 Monocryl.   The knee was cleaned, dried, dressed sterilely using Dermabond and  Aquacel dressing.  Drain site dressed separately.  The patient was then   brought to recovery room in stable condition, tolerating the procedure   well.   Please note that Physician  Assistant, Lanney Gins, was present for the entirety of the case, and was utilized for pre-operative positioning, peri-operative retractor management, general facilitation of the procedure.  He was also utilized for primary wound closure at the end of the case.              Madlyn Frankel Charlann Boxer, M.D.

## 2011-05-14 NOTE — Anesthesia Procedure Notes (Signed)
Spinal  Patient location during procedure: OR Start time: 05/14/2011 10:39 AM End time: 05/14/2011 10:49 AM Staffing Anesthesiologist: Lestine Box B Performed by: anesthesiologist  Preanesthetic Checklist Completed: patient identified, site marked, surgical consent, pre-op evaluation, timeout performed, IV checked, risks and benefits discussed and monitors and equipment checked Spinal Block Patient position: sitting Prep: Betadine and site prepped and draped Patient monitoring: blood pressure, continuous pulse ox, cardiac monitor and heart rate Approach: right paramedian Location: L3-4 Injection technique: single-shot Needle Needle type: Quincke  Needle gauge: 25 G Needle length: 10 cm Needle insertion depth: 3 cm Assessment Sensory level: T6 Additional Notes [id number], 2014-06

## 2011-05-14 NOTE — Anesthesia Preprocedure Evaluation (Signed)
Anesthesia Evaluation  Patient identified by MRN, date of birth, ID band Patient awake    Reviewed: Allergy & Precautions, H&P , NPO status , Patient's Chart, lab work & pertinent test results, reviewed documented beta blocker date and time   Airway Mallampati: II TM Distance: >3 FB Neck ROM: Full    Dental   Pulmonary neg pulmonary ROS,  breath sounds clear to auscultation        Cardiovascular negative cardio ROS  Rhythm:Regular Rate:Normal  Denies cardiac symptoms   Neuro/Psych negative neurological ROS  negative psych ROS   GI/Hepatic negative GI ROS, Neg liver ROS,   Endo/Other  negative endocrine ROS  Renal/GU negative Renal ROS  negative genitourinary   Musculoskeletal negative musculoskeletal ROS (+)   Abdominal   Peds negative pediatric ROS (+)  Hematology negative hematology ROS (+)   Anesthesia Other Findings   Reproductive/Obstetrics negative OB ROS                           Anesthesia Physical Anesthesia Plan  ASA: I  Anesthesia Plan: Spinal   Post-op Pain Management:    Induction: Intravenous  Airway Management Planned: Mask  Additional Equipment:   Intra-op Plan:   Post-operative Plan:   Informed Consent:   Plan Discussed with: CRNA and Surgeon  Anesthesia Plan Comments:         Anesthesia Quick Evaluation

## 2011-05-14 NOTE — Preoperative (Signed)
Beta Blockers   Reason not to administer Beta Blockers:Not Applicable 

## 2011-05-14 NOTE — Interval H&P Note (Signed)
History and Physical Interval Note:  05/14/2011 10:03 AM  Courtney Lawrence  has presented today for surgery, with the diagnosis of Osteoarthritis of the Left Knee  The various methods of treatment have been discussed with the patient and family. After consideration of risks, benefits and other options for treatment, the patient has consented to  Procedure(s) (LRB): LEFT TOTAL KNEE ARTHROPLASTY (Left) as a surgical intervention .  The patients' history has been reviewed, patient examined, no change in status, stable for surgery.  I have reviewed the patients' chart and labs.  Questions were answered to the patient's satisfaction.     Shelda Pal

## 2011-05-14 NOTE — Transfer of Care (Signed)
Immediate Anesthesia Transfer of Care Note  Patient: Courtney Lawrence  Procedure(s) Performed: Procedure(s) (LRB): TOTAL KNEE ARTHROPLASTY (Left)  Patient Location: PACU  Anesthesia Type: Spinal  Level of Consciousness: awake, alert , oriented and patient cooperative  Airway & Oxygen Therapy: Patient Spontanous Breathing and Patient connected to face mask oxygen  Post-op Assessment: Report given to PACU RN and Post -op Vital signs reviewed and stable  Post vital signs: Reviewed and stable  Complications: No apparent anesthesia complications

## 2011-05-15 LAB — BASIC METABOLIC PANEL WITH GFR
BUN: 19 mg/dL (ref 6–23)
CO2: 27 meq/L (ref 19–32)
Calcium: 8.7 mg/dL (ref 8.4–10.5)
Chloride: 103 meq/L (ref 96–112)
Creatinine, Ser: 0.69 mg/dL (ref 0.50–1.10)
GFR calc Af Amer: >90 mL/min
GFR calc non Af Amer: 86 mL/min — ABNORMAL LOW
Glucose, Bld: 177 mg/dL — ABNORMAL HIGH (ref 70–99)
Potassium: 4.5 meq/L (ref 3.5–5.1)
Sodium: 138 meq/L (ref 135–145)

## 2011-05-15 LAB — CBC
Hemoglobin: 11.3 g/dL — ABNORMAL LOW (ref 12.0–15.0)
MCHC: 33.1 g/dL (ref 30.0–36.0)
Platelets: 178 10*3/uL (ref 150–400)

## 2011-05-15 MED ORDER — HYDROMORPHONE HCL PF 1 MG/ML IJ SOLN
0.5000 mg | INTRAMUSCULAR | Status: DC | PRN
  Administered 2011-05-15 (×2): 1 mg via INTRAVENOUS
  Filled 2011-05-15 (×2): qty 1

## 2011-05-15 MED ORDER — KETOROLAC TROMETHAMINE 15 MG/ML IJ SOLN
15.0000 mg | Freq: Four times a day (QID) | INTRAMUSCULAR | Status: DC
  Administered 2011-05-15 – 2011-05-17 (×7): 15 mg via INTRAVENOUS
  Filled 2011-05-15 (×8): qty 1

## 2011-05-15 NOTE — Progress Notes (Signed)
Subjective: 1 Day Post-Op Procedure(s) (LRB): TOTAL KNEE ARTHROPLASTY (Left)   Patient reports pain as mild. Difficulty in staying awake, feeling very drowsy. No other events.  Objective:   VITALS:   Filed Vitals:   05/15/11 0931  BP: 123/75  Pulse: 76  Temp: 97.4 F (36.3 C)  Resp: 16    Neurovascular intact Dorsiflexion/Plantar flexion intact Incision: dressing C/D/I No cellulitis present Compartment soft  LABS  Basename 05/15/11 0410  HGB 11.3*  HCT 34.1*  WBC 11.5*  PLT 178     Basename 05/15/11 0410  NA 138  K 4.5  BUN 19  CREATININE 0.69  GLUCOSE 177*     Assessment/Plan: 1 Day Post-Op Procedure(s) (LRB): TOTAL KNEE ARTHROPLASTY (Left)   Foley cath d/c'ed HV drain d/c'ed Advance diet Up with therapy D/C IV fluids D/c to home when ready, possibly Wednesday/Thursday  Anastasio Auerbach. Donzella Carrol   PAC  05/15/2011, 11:00 AM

## 2011-05-15 NOTE — Evaluation (Signed)
Physical Therapy Evaluation Patient Details Name: Courtney Lawrence MRN: [id number] DOB: Apr 23, 1940 Today's Date: 05/15/2011  Problem List:  Patient Active Problem List  Diagnoses  . S/P left TKA    Past Medical History:  Past Medical History  Diagnosis Date  . Seasonal allergies   . Headache     MIGRAINES   Past Surgical History:  Past Surgical History  Procedure Date  . Foot surgery 2012 ALSO 2003    PT Assessment/Plan/Recommendation PT Assessment Clinical Impression Statement: pt is groggy from meds, tolerated short amb. pt will need to be near Mod. Independent to DC home as spouse will not be able to physically assist.  Pt will consider SNF, but prefers Home. pt will benefit from PT for rom, strength , mobility to dc home. PT Recommendation/Assessment: Patient will need skilled PT in the acute care venue PT Problem List: Decreased strength;Decreased range of motion;Decreased activity tolerance;Decreased knowledge of use of DME;Decreased safety awareness;Cardiopulmonary status limiting activity;Pain Barriers to Discharge: Decreased caregiver support PT Therapy Diagnosis : Difficulty walking;Acute pain;Generalized weakness PT Plan PT Frequency: 7X/week PT Treatment/Interventions: DME instruction;Gait training;Stair training;Functional mobility training;Therapeutic activities;Therapeutic exercise;Patient/family education PT Recommendation Recommendations for Other Services: OT consult Follow Up Recommendations: Home health PT;Skilled nursing facility Equipment Recommended: Rolling walker with 5" wheels PT Goals  Acute Rehab PT Goals PT Goal Formulation: With patient Time For Goal Achievement: 7 days Pt will go Supine/Side to Sit: with supervision PT Goal: Supine/Side to Sit - Progress: Goal set today Pt will go Sit to Supine/Side: with supervision PT Goal: Sit to Supine/Side - Progress: Goal set today Pt will go Sit to Stand: with supervision PT Goal: Sit to Stand -  Progress: Goal set today Pt will Transfer Bed to Chair/Chair to Bed: with supervision PT Transfer Goal: Bed to Chair/Chair to Bed - Progress: Goal set today Pt will Ambulate: 51 - 150 feet;with supervision;with rolling walker PT Goal: Ambulate - Progress: Goal set today Pt will Go Up / Down Stairs: 3-5 stairs;with min assist;with least restrictive assistive device PT Goal: Up/Down Stairs - Progress: Goal set today Pt will Perform Home Exercise Program: with supervision, verbal cues required/provided PT Goal: Perform Home Exercise Program - Progress: Goal set today  PT Evaluation Precautions/Restrictions  Precautions Precautions: Knee Required Braces or Orthoses: Yes Restrictions Weight Bearing Restrictions: Yes LLE Weight Bearing: Weight bearing as tolerated Prior Functioning  Home Living Lives With: Spouse Type of Home: House Home Layout: One level Home Access: Stairs to enter;Ramped entrance Entrance Stairs-Rails: Doctor, general practice of Steps: 5 Bathroom Shower/Tub: Health visitor: Standard (had old BSC but not sturdy) Home Adaptive Equipment: None (needs new dme) Additional Comments: daughter can be there at night to assist; husband has back problems Prior Function Level of Independence: Independent with basic ADLs Able to Take Stairs?: Yes Driving: Yes Cognition Cognition Arousal/Alertness: Awake/alert Overall Cognitive Status: Appears within functional limits for tasks assessed Orientation Level: Oriented X4 Sensation/Coordination   Extremity Assessment RUE Assessment RUE Assessment: Within Functional Limits (old dislocation; now wfls) LUE Assessment LUE Assessment: Within Functional Limits RLE Assessment RLE Assessment: Within Functional Limits LLE Assessment LLE Assessment: Exceptions to WFL LLE AROM (degrees) LLE Overall AROM Comments: tolerated 30 degrees knee flexion LLE Strength LLE Overall Strength Comments: slr with mod  assist Mobility (including Balance) Bed Mobility Bed Mobility: Yes Supine to Sit: 3: Mod assist;With rails;HOB elevated (Comment degrees) Supine to Sit Details (indicate cue type and reason): vc for moving to edge assist of LLE. Transfers Transfers:  Yes Sit to Stand: 1: +2 Total assist;Patient percentage (comment) Sit to Stand Details (indicate cue type and reason): vc  and assist for stability Stand to Sit: 1: +2 Total assist;To chair/3-in-1;With upper extremity assist Stand to Sit Details: vc for reaching to arms, step out LLE Stand Pivot Transfers: 1: +2 Total assist Stand Pivot Transfer Details (indicate cue type and reason): vc for posture. Ambulation/Gait Ambulation/Gait: Yes Ambulation/Gait Assistance: 1: +2 Total assist Ambulation/Gait Assistance Details (indicate cue type and reason): vc for posture. Ambulation Distance (Feet): 25 Feet Assistive device: Rolling walker Gait Pattern: Step-to pattern  Posture/Postural Control Posture/Postural Control: No significant limitations Exercise  Total Joint Exercises Ankle Circles/Pumps: AROM;Left;10 reps Quad Sets: AROM;Left;10 reps Heel Slides: AAROM;Left;10 reps Hip ABduction/ADduction: AAROM;Left;10 reps Straight Leg Raises: AAROM;Left;10 reps End of Session PT - End of Session Equipment Utilized During Treatment: Left knee immobilizer Activity Tolerance: Patient tolerated treatment well (groggy) Patient left: in chair;with call bell in reach;with family/visitor present Nurse Communication: Mobility status for transfers General Behavior During Session: [facility] for tasks performed Cognition: [facility] for tasks performed  Rada Hay 05/15/2011, 5:12 PM  [phone]

## 2011-05-15 NOTE — Progress Notes (Signed)
Foley catheter discontinued at 0640. Pt tolerated well with no complaints.

## 2011-05-15 NOTE — Evaluation (Signed)
Occupational Therapy Evaluation Patient Details Name: Courtney Lawrence MRN: [id number] DOB: 31-Mar-1940 Today's Date: 05/15/2011  Problem List:  Patient Active Problem List  Diagnoses  . S/P left TKA    Past Medical History:  Past Medical History  Diagnosis Date  . Seasonal allergies   . Headache     MIGRAINES   Past Surgical History:  Past Surgical History  Procedure Date  . Foot surgery 2012 ALSO 2003    OT Assessment/Plan/Recommendation OT Assessment Clinical Impression Statement: This 71 year old female was admitted for L TKA.  She had previous R foot surgery and was NWB, so she needs cues to place wt on LLE.  She was independent prior to admission with ADLs.  She is appropriate for skilled OT to increase independence with adls with min A to min guard goals in acute OT Recommendation/Assessment: Patient will need skilled OT in the acute care venue OT Problem List: Decreased activity tolerance;Decreased knowledge of use of DME or AE;Decreased knowledge of precautions;Pain Barriers to Discharge: Other (comment) (husband cannot lift pt) OT Therapy Diagnosis : Generalized weakness OT Plan OT Frequency: Min 2X/week OT Treatment/Interventions: Self-care/ADL training;DME and/or AE instruction;Therapeutic activities;Patient/family education OT Recommendation Follow Up Recommendations: Home health OT if pt progresses to Supervision level (husband cannot lift) otherwise, STSNF Equipment Recommended: 3 in 1 bedside comode Individuals Consulted Consulted and Agree with Results and Recommendations: Patient;Family member/caregiver Family Member Consulted: husband and daughter OT Goals Acute Rehab OT Goals OT Goal Formulation: With patient Time For Goal Achievement: 7 days ADL Goals Pt Will Perform Grooming: with supervision;Standing at sink ADL Goal: Grooming - Progress: Goal set today Pt Will Perform Lower Body Bathing: with min assist;with adaptive equipment;Sit to stand from  chair ADL Goal: Lower Body Bathing - Progress: Goal set today Pt Will Transfer to Toilet: with min assist;Ambulation;3-in-1;with cueing (comment type and amount) (min guard with min vcs) ADL Goal: Toilet Transfer - Progress: Goal set today Pt Will Perform Toileting - Clothing Manipulation: with supervision;Standing ADL Goal: Toileting - Clothing Manipulation - Progress: Goal set today Pt Will Perform Tub/Shower Transfer: Shower transfer;Other (comment);with min assist;Ambulation (3:1 with min guard) ADL Goal: Tub/Shower Transfer - Progress: Goal set today  OT Evaluation Precautions/Restrictions  Precautions Precautions: Knee Required Braces or Orthoses: Yes Restrictions Weight Bearing Restrictions: Yes LLE Weight Bearing: Weight bearing as tolerated Prior Functioning Home Living Lives With: Spouse Bathroom Shower/Tub: Walk-in Stage manager: Standard (had old BSC but not sturdy) Additional Comments: daughter can be there at night to assist; husband has back problems Prior Function Level of Independence: Independent with basic ADLs ADL ADL Grooming: Simulated;Set up Where Assessed - Grooming: Sitting, chair;Supported Upper Body Bathing: Simulated;Set up Where Assessed - Upper Body Bathing: Sitting, chair;Supported Lower Body Bathing: Simulated;Moderate assistance Where Assessed - Lower Body Bathing: Sit to stand from chair Upper Body Dressing: Simulated;Minimal assistance;Other (comment) (iv) Where Assessed - Upper Body Dressing: Sitting, chair;Supported Lower Body Dressing: Simulated;Moderate assistance Where Assessed - Lower Body Dressing: Sit to stand from chair Toilet Transfer: Performed;Comment for patient %;+2 Total assistance (75% for safety:  pt had low BP earlier) Toilet Transfer Details (indicate cue type and reason): mod cues:  pt very anxious Toilet Transfer Method: Stand pivot Toilet Transfer Equipment: Bedside commode Toileting - Clothing Manipulation:  Simulated;Minimal assistance Where Assessed - Glass blower/designer Manipulation: Standing Toileting - Hygiene: Performed;Set up Where Assessed - Toileting Hygiene: Sit on 3-in-1 or toilet Equipment Used: Rolling walker Ambulation Related to ADLs: cotx with PT; PT walked with  pt in the hall.  Pt very anxious, many questions ADL Comments: family present in the room.  Pt bought leg lifter.  Husband states pt should not cross R LE under L due to pins--previous sx Vision/Perception  Vision - History Patient Visual Report: No change from baseline Cognition Cognition Overall Cognitive Status: Appears within functional limits for tasks assessed (very anxious; wants step by step instructions) Orientation Level: Oriented X4 Sensation/Coordination   Extremity Assessment RUE Assessment RUE Assessment: Within Functional Limits (old dislocation; now wfls) LUE Assessment LUE Assessment: Within Functional Limits Mobility  Transfers Transfers: Yes Sit to Stand: 1: +2 Total assist;Patient percentage (comment) (75%) Exercises   End of Session OT - End of Session Equipment Utilized During Treatment: Left knee immobilizer Activity Tolerance: Patient limited by fatigue Patient left: in chair;with call bell in reach;with family/visitor present General Behavior During Session: Other (comment) (anxious) Cognition: [facility] for tasks performed Marica Otter, OTR/L [phone] 05/15/2011  Shinita Mac 05/15/2011, 2:59 PM

## 2011-05-15 NOTE — Progress Notes (Addendum)
Physical Therapy Treatment Patient Details Name: AMAL RENBARGER MRN: [id number] DOB: Feb 08, 1941 Today's Date: 05/15/2011  PT Assessment/Plan  PT - Assessment/Plan Comments on Treatment Session: pt tolerated better, still very tired PT Plan: Discharge plan remains appropriate PT Frequency: 7X/week Recommendations for Other Services: OT consult Follow Up Recommendations: Home health PT;Skilled nursing facility Equipment Recommended: Rolling walker with 5" wheels PT Goals  Acute Rehab PT Goals PT Goal Formulation: With patient Time For Goal Achievement: 7 days Pt will go Supine/Side to Sit: with supervision PT Goal: Supine/Side to Sit - Progress: Goal set today Pt will go Sit to Supine/Side: with supervision PT Goal: Sit to Supine/Side - Progress: Goal set today Pt will go Sit to Stand: with supervision PT Goal: Sit to Stand - Progress: Goal set today Pt will Transfer Bed to Chair/Chair to Bed: with supervision PT Transfer Goal: Bed to Chair/Chair to Bed - Progress: Goal set today Pt will Ambulate: 51 - 150 feet;with supervision;with rolling walker PT Goal: Ambulate - Progress: Goal set today Pt will Go Up / Down Stairs: 3-5 stairs;with min assist;with least restrictive assistive device PT Goal: Up/Down Stairs - Progress: Goal set today Pt will Perform Home Exercise Program: with supervision, verbal cues required/provided PT Goal: Perform Home Exercise Program - Progress: Goal set today  PT Treatment Precautions/Restrictions  Precautions Precautions: Knee Required Braces or Orthoses: Yes Restrictions Weight Bearing Restrictions: Yes LLE Weight Bearing: Weight bearing as tolerated Mobility (including Balance) Bed Mobility Bed Mobility: Yes   Sit to Supine: 3: Mod assist;With rail;HOB flat Transfers Transfers: Yes Sit to Stand: 3: Mod assist;From chair/3-in-1;With upper extremity assist Sit to Stand Details (indicate cue type and reason): vc and assist for stability Stand  to Sit: 4: Min assist;To chair/3-in-1;To bed Stand to Sit Details: vc for reaching to arms, step out LLE Stand Pivot Transfers: 3: Mod assist from recliner to bsc then took steps backward to bed. Stand Pivot Transfer Details (indicate cue type and reason): vc for safety Ambulation/Gait Assistive device: Rolling walker Gait Pattern: Step-to pattern  Posture/Postural Control Posture/Postural Control: No significant limitations Exercise  End of Session PT - End of Session Equipment Utilized During Treatment: Left knee immobilizer Activity Tolerance: Patient tolerated treatment well Patient left: in bed;with call bell in reach Nurse Communication: Mobility status for transfers General Behavior During Session: [facility] for tasks performed Cognition: [facility] for tasks performed  Rada Hay 05/15/2011, 5:21 PM

## 2011-05-15 NOTE — Progress Notes (Signed)
Utilization review completed.  

## 2011-05-16 LAB — CBC
Hemoglobin: 9.3 g/dL — ABNORMAL LOW (ref 12.0–15.0)
MCH: 30.6 pg (ref 26.0–34.0)
Platelets: 151 10*3/uL (ref 150–400)
RBC: 3.04 MIL/uL — ABNORMAL LOW (ref 3.87–5.11)
WBC: 10.9 10*3/uL — ABNORMAL HIGH (ref 4.0–10.5)

## 2011-05-16 LAB — BASIC METABOLIC PANEL
CO2: 27 mEq/L (ref 19–32)
Chloride: 100 mEq/L (ref 96–112)
Glucose, Bld: 120 mg/dL — ABNORMAL HIGH (ref 70–99)
Potassium: 4.2 mEq/L (ref 3.5–5.1)
Sodium: 131 mEq/L — ABNORMAL LOW (ref 135–145)

## 2011-05-16 MED ORDER — BLISTEX EX OINT
TOPICAL_OINTMENT | CUTANEOUS | Status: AC
  Administered 2011-05-16: 10:00:00
  Filled 2011-05-16: qty 10

## 2011-05-16 NOTE — Progress Notes (Signed)
Subjective: 2 Days Post-Op Procedure(s) (LRB): TOTAL KNEE ARTHROPLASTY (Left)   Patient reports pain as moderate. States that she has pain with movement of the knee. She seems less drowsy this morning. No other events.  Objective:   VITALS:   Filed Vitals:   05/16/11 0607  BP: 103/67  Pulse: 74  Temp: 98.2 F (36.8 C)  Resp: 16    Neurovascular intact Dorsiflexion/Plantar flexion intact Incision: dressing C/D/I No cellulitis present Compartment soft  LABS  Basename 05/16/11 0421 05/15/11 0410  HGB 9.3* 11.3*  HCT 28.0* 34.1*  WBC 10.9* 11.5*  PLT 151 178     Basename 05/16/11 0421 05/15/11 0410  NA 131* 138  K 4.2 4.5  BUN 13 19  CREATININE 0.70 0.69  GLUCOSE 120* 177*     Assessment/Plan: 2 Days Post-Op Procedure(s) (LRB): TOTAL KNEE ARTHROPLASTY (Left)   ACE bandage d/c'ed Up with therapy Plan for discharge tomorrow SNF/home if continues to do well SW consult for concerns of assistance at home.   Anastasio Auerbach Ailish Prospero   PAC  05/16/2011, 8:20 AM

## 2011-05-16 NOTE — Progress Notes (Signed)
CARE MANAGEMENT NOTE 05/16/2011  Patient:  Courtney Lawrence, Courtney Lawrence   Account Number:  [id number]  Date Initiated:  05/16/2011  Documentation initiated by:  Colleen Can  Subjective/Objective Assessment:   dx osteoarthritius left knee; total knee replacemnt     Action/Plan:   CM spoke with patient. Currently she is unsure of whether she will go to [facility] or home. Currently she is not progressing with PT . Pt has been seen by CSW . If she goes home she does want [place] and will have caregiver   Anticipated DC Date:  05/17/2011   Anticipated DC Plan:  SKILLED NURSING ACILITY  In-house referral  Clinical Social Worker      DC Planning Services  CM consult      Choice offered to / List presented to:  C-1 Patient           HH agency  [facility]   Status of service:  In process, will continue to follow Medicare Important Message given?   (If response is "NO", the following Medicare IM given date fields will be blank) Comments:  05/16/2011 Raynelle Bring BSN CCM [phone] List of choice [facility] agencies has been placed on chart. CM will follow pt progress

## 2011-05-16 NOTE — Progress Notes (Signed)
Physical Therapy Treatment Patient Details Name: Courtney Lawrence MRN: [id number] DOB: 04/28/1940 Today's Date: 05/16/2011 [id number] PT Assessment/Plan  PT - Assessment/Plan Comments on Treatment Session: pt very sleepy, very fatiqued. pt is not progressing at level to safely to DC to home. will see how pt is progressing in AM PT Plan: Frequency remains appropriate;Discharge plan remains appropriate PT Frequency: 7X/week Recommendations for Other Services: OT consult Follow Up Recommendations: Skilled nursing facility Equipment Recommended: Defer to next venue PT Goals  Acute Rehab PT Goals Pt will go Supine/Side to Sit: with supervision PT Goal: Supine/Side to Sit - Progress: Progressing toward goal Pt will go Sit to Supine/Side: with supervision PT Goal: Sit to Supine/Side - Progress: Progressing toward goal Pt will go Sit to Stand: with supervision PT Goal: Sit to Stand - Progress: Progressing toward goal Pt will Transfer Bed to Chair/Chair to Bed: with supervision PT Transfer Goal: Bed to Chair/Chair to Bed - Progress: Progressing toward goal Pt will Ambulate: 51 - 150 feet;with supervision;with rolling walker PT Goal: Ambulate - Progress: Progressing toward goal Pt will Perform Home Exercise Program: with supervision, verbal cues required/provided PT Goal: Perform Home Exercise Program - Progress: Progressing toward goal  PT Treatment Precautions/Restrictions  Precautions Precautions: Knee Required Braces or Orthoses: Yes Restrictions Weight Bearing Restrictions: Yes LLE Weight Bearing: Weight bearing as tolerated Mobility (including Balance) Bed Mobility Bed Mobility: Yes Supine to Sit: 4: Min assist;HOB flat Supine to Sit Details (indicate cue type and reason): vc for moving to edge assist of LLE Sit to Supine: 4: Min assist Transfers Sit to Stand: 4: Min assist;From chair/3-in-1;With armrests;With upper extremity assist Sit to Stand Details (indicate cue type and  reason): multiple vc for safety to reach for armrests, stepping out LLE. Stand to Sit: 4: Min assist;To chair/3-in-1;To bed Stand to Sit Details: vc for reaching to arms, step out LLE Ambulation/Gait Ambulation/Gait Assistance: 4: Min assist Ambulation/Gait Assistance Details (indicate cue type and reason): vc for sequence Ambulation Distance (Feet): 20 Feet Assistive device: Rolling walker Gait Pattern: Step-to pattern  Posture/Postural Control Posture/Postural Control: No significant limitations Exercise  Total Joint Exercises Ankle Circles/Pumps: AROM;Left;10 reps Quad Sets: AROM;Left;10 reps Heel Slides: AAROM;Left;10 reps Hip ABduction/ADduction: AAROM;Left;10 reps Straight Leg Raises: AAROM;Left;10 reps End of Session PT - End of Session Equipment Utilized During Treatment:  (did not use and did well) Activity Tolerance: Patient limited by fatigue Patient left: in bed;with call bell in reach Nurse Communication: Mobility status for transfers General Behavior During Session:  (very drowsy) Cognition: Impaired (decreased safety)  Rada Hay 05/16/2011, 4:26 PM

## 2011-05-16 NOTE — Progress Notes (Signed)
Physical Therapy Treatment Patient Details Name: Courtney Lawrence MRN: [id number] DOB: 17-Oct-1940 Today's Date: 05/16/2011 [id number] Assessment/Plan  PT - Assessment/Plan Comments on Treatment Session: pt more alert this am. able to tolerate more activity.  RECOMMEND SNF for pt safety, pt spouse not physically able to assit and pt has 5 steps or steep ramp. PT Plan: Discharge plan needs to be updated PT Frequency: 7X/week Recommendations for Other Services: OT consult Follow Up Recommendations: Skilled nursing facility Equipment Recommended: Defer to next venue PT Goals  Acute Rehab PT Goals Pt will go Supine/Side to Sit: with supervision PT Goal: Supine/Side to Sit - Progress: Progressing toward goal Pt will go Sit to Stand: with supervision PT Goal: Sit to Stand - Progress: Progressing toward goal Pt will Transfer Bed to Chair/Chair to Bed: with supervision PT Transfer Goal: Bed to Chair/Chair to Bed - Progress: Progressing toward goal Pt will Ambulate: 51 - 150 feet;with supervision;with rolling walker PT Goal: Ambulate - Progress: Progressing toward goal Pt will Perform Home Exercise Program: with supervision, verbal cues required/provided PT Goal: Perform Home Exercise Program - Progress: Progressing toward goal  PT Treatment Precautions/Restrictions  Precautions Precautions: Knee Required Braces or Orthoses: Yes Restrictions Weight Bearing Restrictions: Yes LLE Weight Bearing: Weight bearing as tolerated Mobility (including Balance) Bed Mobility Bed Mobility: Yes Supine to Sit: 4: Min assist;HOB flat Supine to Sit Details (indicate cue type and reason): vc for moving to edge assist of LLE Sit to Supine: 4: Min assist Transfers Sit to Stand: From bed;From chair/3-in-1;With upper extremity assist Sit to Stand Details (indicate cue type and reason): vc and assist for stability Stand to Sit: 4: Min assist;To chair/3-in-1;To bed Stand to Sit Details: vc for reaching to arms,  step out LLE Ambulation/Gait Ambulation/Gait Assistance: 4: Min assist Ambulation/Gait Assistance Details (indicate cue type and reason): vc for posture, sequence Ambulation Distance (Feet): 100 Feet (20 ft first) Assistive device: Rolling walker Gait Pattern: Step-to pattern  Posture/Postural Control Posture/Postural Control: No significant limitations Exercise  Total Joint Exercises Ankle Circles/Pumps: AROM;Left;10 reps Quad Sets: AROM;Left;10 reps Heel Slides: AAROM;Left;10 reps Hip ABduction/ADduction: AAROM;Left;10 reps Straight Leg Raises: AAROM;Left;10 reps End of Session PT - End of Session Equipment Utilized During Treatment:  (did not use and did well) Activity Tolerance: Patient limited by fatigue Patient left: in chair;with family/visitor present Nurse Communication: Mobility status for transfers General Behavior During Session: [facility] for tasks performed Cognition: Impaired (decreased safety)  Rada Hay 05/16/2011, 4:21 PM

## 2011-05-16 NOTE — Progress Notes (Signed)
Occupational Therapy Treatment Patient Details Name: Courtney Lawrence MRN: [id number] DOB: February 25, 1941 Today's Date: 05/16/2011  OT Assessment/Plan OT Assessment/Plan OT Plan: Discharge plan needs to be updated OT Frequency: Min 1X/week Follow Up Recommendations: Skilled nursing facility Equipment Recommended: Defer to next venue OT Goals ADL Goals Pt Will Perform Grooming: with supervision;Standing at sink ADL Goal: Grooming - Progress: Progressing toward goals Pt Will Transfer to Toilet: with min assist;Ambulation;3-in-1;with cueing (comment type and amount) ADL Goal: Toilet Transfer - Progress: Met  OT Treatment Precautions/Restrictions  Precautions Precautions: Knee Restrictions Weight Bearing Restrictions: Yes LLE Weight Bearing: Weight bearing as tolerated   ADL ADL Grooming: Performed;Teeth care;Wash/dry face;Minimal assistance;Other (comment) (min guard) Where Assessed - Grooming: Standing at sink Toilet Transfer: Performed;Minimal assistance;Other (comment) (pt lost balance one time when backing up to commode.) Toilet Transfer Details (indicate cue type and reason): min cues for safety; pt impulsive; moves quickly Toilet Transfer Method: Ambulating Toilet Transfer Equipment: Raised toilet seat with arms (or 3-in-1 over toilet) Toileting - Hygiene: Performed;Set up Where Assessed - Toileting Hygiene: Sit on 3-in-1 or toilet Ambulation Related to ADLs: Pt was on commode without KI.  Got off and back on commode and needed min A to recover balance.  Min A secondary to decreased safety and impulsivity ADL Comments: Min guard for standing at sink secondary to impulsivity Mobility  Bed Mobility Bed Mobility: Yes Sit to Supine: 4: Min assist Transfers Transfers: Yes Sit to Stand: 4: Min assist Exercises    End of Session OT - End of Session Equipment Utilized During Treatment: Other (comment) (did not use KI--it was not on when she was in bathroom) Activity Tolerance:  Patient limited by fatigue Patient left: in bed;with call bell in reach General Behavior During Session: [facility] for tasks performed Cognition: Impaired (decreased safety) Marica Otter, OTR/L [phone] 05/16/2011 Pranshu Lyster  05/16/2011, 4:16 PM

## 2011-05-17 DIAGNOSIS — M25569 Pain in unspecified knee: Secondary | ICD-10-CM | POA: Diagnosis not present

## 2011-05-17 DIAGNOSIS — D649 Anemia, unspecified: Secondary | ICD-10-CM | POA: Diagnosis not present

## 2011-05-17 DIAGNOSIS — D62 Acute posthemorrhagic anemia: Secondary | ICD-10-CM | POA: Diagnosis not present

## 2011-05-17 DIAGNOSIS — Z96659 Presence of unspecified artificial knee joint: Secondary | ICD-10-CM | POA: Diagnosis not present

## 2011-05-17 DIAGNOSIS — R197 Diarrhea, unspecified: Secondary | ICD-10-CM | POA: Diagnosis not present

## 2011-05-17 DIAGNOSIS — M1710 Unilateral primary osteoarthritis, unspecified knee: Secondary | ICD-10-CM | POA: Diagnosis not present

## 2011-05-17 DIAGNOSIS — Z5189 Encounter for other specified aftercare: Secondary | ICD-10-CM | POA: Diagnosis not present

## 2011-05-17 DIAGNOSIS — K5900 Constipation, unspecified: Secondary | ICD-10-CM | POA: Diagnosis not present

## 2011-05-17 DIAGNOSIS — M129 Arthropathy, unspecified: Secondary | ICD-10-CM | POA: Diagnosis not present

## 2011-05-17 DIAGNOSIS — E876 Hypokalemia: Secondary | ICD-10-CM | POA: Diagnosis not present

## 2011-05-17 DIAGNOSIS — M1990 Unspecified osteoarthritis, unspecified site: Secondary | ICD-10-CM | POA: Diagnosis not present

## 2011-05-17 DIAGNOSIS — G43811 Other migraine, intractable, with status migrainosus: Secondary | ICD-10-CM | POA: Diagnosis not present

## 2011-05-17 MED ORDER — DOCUSATE SODIUM 100 MG PO CAPS: 100.0000 mg | ORAL_CAPSULE | Freq: Two times a day (BID) | ORAL | Status: AC

## 2011-05-17 MED ORDER — METHOCARBAMOL 500 MG PO TABS: 500.0000 mg | ORAL_TABLET | Freq: Four times a day (QID) | ORAL | Status: AC | PRN

## 2011-05-17 MED ORDER — DIPHENHYDRAMINE HCL 25 MG PO CAPS: 25.0000 mg | ORAL_CAPSULE | Freq: Four times a day (QID) | ORAL | Status: AC | PRN

## 2011-05-17 MED ORDER — HYDROCODONE-ACETAMINOPHEN 7.5-325 MG PO TABS: 1.0000 | ORAL_TABLET | ORAL | Status: AC | PRN

## 2011-05-17 MED ORDER — RIVAROXABAN 10 MG PO TABS: 10.0000 mg | ORAL_TABLET | ORAL | Status: DC

## 2011-05-17 MED ORDER — FERROUS SULFATE 325 (65 FE) MG PO TABS: 325.0000 mg | ORAL_TABLET | Freq: Three times a day (TID) | ORAL | Status: AC

## 2011-05-17 MED ORDER — POLYETHYLENE GLYCOL 3350 17 G PO PACK: 17.0000 g | PACK | Freq: Two times a day (BID) | ORAL | Status: AC

## 2011-05-17 NOTE — Discharge Summary (Signed)
Physician Discharge Summary  Patient ID: Courtney Lawrence MRN: [id number] DOB/AGE: [birth year] 71 y.o.  Admit date: 05/14/2011 Discharge date: 05/17/2011  Procedures:  Procedure(s) (LRB): TOTAL KNEE ARTHROPLASTY (Left)  Attending Physician: Shelda Pal, MD   Admission Diagnoses: Left knee OA and pain   Discharge Diagnoses:  Principal Problem:  *S/P left TKA Seasonal allergies Migraines  HPI: Pt is a 71 y.o. female complaining of left knee pain for years off and on. Pain had continually increased since the beginning. The pain really increased after having surgery on her right foot, causing her to stress the left knee. X-rays in the clinic show end-stage arthritic changes of the left knee. Pt has tried various conservative treatments which have failed to alleviate their symptoms including NSAIDs and steroid injections. Various options are discussed with the patient. Risks, benefits and expectations were discussed with the patient. Patient understand the risks, benefits and expectations and wishes to proceed with surgery.   PCP: Astrid Divine, MD, MD   Discharged Condition: good  Hospital Course:  Patient underwent the above stated procedure on 05/14/2011. Patient tolerated the procedure well and brought to the recovery room in good condition and subsequently to the floor.  POD #1 BP: 123/75 ; Pulse: 76 ; Temp: 97.4 F (36.3 C) ; Resp: 16  Pt's foley was removed, as well as the hemovac drain removed. IV was changed to a saline lock. Patient reports pain as mild. Difficulty in staying awake, feeling very drowsy. No other events. Neurovascular intact, dorsiflexion/plantar flexion intact, incision: dressing C/D/I, no cellulitis present and compartment soft.   LABS  Basename  05/15/11 0410   HGB  11.3  HCT  34.1    POD #2  BP: 103/67 ; Pulse: 74 ; Temp: 98.2 F (36.8 C) ; Resp: 16  Patient reports pain as moderate. States that she has pain with movement of the knee. She  seems less drowsy this morning. No other events. Neurovascular intact, dorsiflexion/plantar flexion intact, incision: dressing C/D/I, no cellulitis present and compartment soft.   LABS  Basename  05/16/11 0421   HGB  9.3  HCT  28.0   POD #3  BP: 147/81 ; Pulse: 92 ; Temp: 97.9 F (36.6 C) ; Resp: 15 Patient reports pain as mild. Progressing very slowly with PT, would benefit from additional help. Ready for discharge to SNF. Neurovascular intact, dorsiflexion/plantar flexion intact, incision: dressing C/D/I, no cellulitis present and compartment soft.  Weakness in the RLE and not able to straight leg raise without assistance.  LABS   No new labs  Discharge Exam: General appearance: alert, cooperative and no distress Extremities: Homans sign is negative, no sign of DVT, no edema, redness or tenderness in the calves or thighs and no ulcers, gangrene or trophic changes  Disposition: SNF with follow up in 2 weeks  Follow-up Information    Follow up with OLIN,Deamonte Sayegh D in 2 weeks.   Contact information:   [facility] [address] [place] [address] [phone]          Discharge Orders    Future Orders Please Complete By Expires   Diet - low sodium heart healthy      Call MD / Call 911      Comments:   If you experience chest pain or shortness of breath, CALL 911 and be transported to the hospital emergency room.  If you develope a fever above 101 F, pus (white drainage) or increased drainage or redness at  the wound, or calf pain, call your surgeon's office.   Discharge instructions      Comments:   Maintain surgical dressing for 8 days, then replace with gauze and tape. Keep the area dry and clean until follow up. Follow up in 2 weeks at [facility]. Call with any questions or concerns.     Constipation Prevention      Comments:   Drink plenty of fluids.  Prune juice may be helpful.  You may use a stool softener,  such as Colace (over the counter) 100 mg twice a day.  Use MiraLax (over the counter) for constipation as needed.   Increase activity slowly as tolerated      Weight Bearing as taught in Physical Therapy      Comments:   Use a walker or crutches as instructed.   Driving restrictions      Comments:   No driving for 4 weeks   TED hose      Comments:   Use stockings (TED hose) for 2 weeks on both leg(s).  You may remove them at night for sleeping.   Change dressing      Comments:   Maintain surgical dressing for 8 days, then change the dressing daily with sterile 4 x 4 inch gauze dressing and tape. Keep the area dry and clean.      Current Discharge Medication List    START taking these medications   Details  diphenhydrAMINE (BENADRYL) 25 mg capsule Take 1 capsule (25 mg total) by mouth every 6 (six) hours as needed for itching, allergies or sleep.    docusate sodium (COLACE) 100 MG capsule Take 1 capsule (100 mg total) by mouth 2 (two) times daily. Refills: 0    ferrous sulfate 325 (65 FE) MG tablet Take 1 tablet (325 mg total) by mouth 3 (three) times daily after meals.    HYDROcodone-acetaminophen (NORCO) 7.5-325 MG per tablet Take 1-2 tablets by mouth every 4 (four) hours as needed for pain. Qty: 120 tablet, Refills: 0    methocarbamol (ROBAXIN) 500 MG tablet Take 1 tablet (500 mg total) by mouth every 6 (six) hours as needed (muscle spasms). Qty: 50 tablet, Refills: 0    polyethylene glycol (MIRALAX / GLYCOLAX) packet Take 17 g by mouth 2 (two) times daily.    rivaroxaban (XARELTO) 10 MG TABS tablet Take 1 tablet (10 mg total) by mouth daily. Qty: 12 tablet, Refills: 0      CONTINUE these medications which have NOT CHANGED   Details  CALCIUM-MAGNESIUM-VITAMIN D PO Take 1 tablet by mouth 2 (two) times daily.    fish oil-omega-3 fatty acids 1000 MG capsule Take 1 g by mouth daily.    Multiple Vitamins-Minerals (ICAPS PO) Take 1 tablet by mouth daily.      glucosamine-chondroitin 500-400 MG tablet Take 1 tablet by mouth 2 (two) times daily.    SUMAtriptan (IMITREX) 100 MG tablet Take 50 mg by mouth every 2 (two) hours as needed. Headache        Signed: Anastasio Auerbach. Alaiyah Bollman   PAC  05/17/2011, 8:58 AM

## 2011-05-17 NOTE — Progress Notes (Signed)
CARE MANAGEMENT NOTE 05/17/2011  Patient:  DEZERAE, FREIBERGER   Account Number:  [id number]  Date Initiated:  05/16/2011  Documentation initiated by:  Colleen Can  Subjective/Objective Assessment:   dx osteoarthritius left knee; total knee replacemnt     Action/Plan:   CM spoke with patient. Currently she is unsure of whether she will go to [facility] or home. Currently she is not progressing with PT . Pt has been seen by CSW . If she goes home she does wants [place] and will have caregiver   Anticipated DC Date:  05/17/2011   Anticipated DC Plan:  SKILLED NURSING FACILITY  In-house referral  Clinical Social Worker      DC Planning Services  CM consult      [facility] Choice  NA   Choice offered to / List presented to:  C-1 Patient   DME arranged  NA      DME agency  NA     HH arranged  NA      HH agency  [facility]   Status of service:  Completed, signed off Medicare Important Message given?  NA - LOS <3 / Initial given by admissions (If response is "NO", the following Medicare IM given date fields will be blank) Date Medicare IM given:   Date Additional Medicare IM given:    Discharge Disposition:  SKILLED NURSING FACILITY

## 2011-05-17 NOTE — Progress Notes (Signed)
Physical Therapy Treatment Patient Details Name: Courtney Lawrence MRN: [id number] DOB: 10-Oct-1940 Today's Date: 05/17/[phone] PT Assessment/Plan  PT - Assessment/Plan Comments on Treatment Session: ptstates she feels much better today. discussed SNF option. pt will benefit from SNF to  improve I for DC home as spouse unable to assist. pt agreeable. PT Plan: Discharge plan remains appropriate Follow Up Recommendations: Skilled nursing facility PT Goals  Acute Rehab PT Goals Pt will go Supine/Side to Sit: with supervision PT Goal: Supine/Side to Sit - Progress: Progressing toward goal Pt will go Sit to Stand: with supervision PT Goal: Sit to Stand - Progress: Progressing toward goal Pt will Transfer Bed to Chair/Chair to Bed: with supervision PT Transfer Goal: Bed to Chair/Chair to Bed - Progress: Progressing toward goal Pt will Ambulate: 51 - 150 feet;with supervision;with rolling walker PT Goal: Ambulate - Progress: Progressing toward goal PT Goal: Up/Down Stairs - Progress: Discontinued (comment) Pt will Perform Home Exercise Program: with supervision, verbal cues required/provided PT Goal: Perform Home Exercise Program - Progress: Progressing toward goal  PT Treatment Precautions/Restrictions  Precautions Precautions: Knee Required Braces or Orthoses: Yes Restrictions Weight Bearing Restrictions: Yes LLE Weight Bearing: Weight bearing as tolerated Mobility (including Balance) Bed Mobility Supine to Sit: 4: Min assist;HOB flat Supine to Sit Details (indicate cue type and reason): supportLLE as ki not in place Transfers Sit to Stand: 5: Supervision;From bed Sit to Stand Details (indicate cue type and reason): pt demo more safety today Stand to Sit: 5: Supervision;To chair/3-in-1;With armrests;With upper extremity assist Stand to Sit Details: vc for reaching to arms, step out LLE Ambulation/Gait Ambulation/Gait Assistance: 4: Min assist Ambulation/Gait Assistance Details  (indicate cue type and reason): vc for sequence Ambulation Distance (Feet): 150 Feet Assistive device: Rolling walker Gait Pattern: Step-to pattern    Exercise  Total Joint Exercises Ankle Circles/Pumps: AROM;Left;10 reps Quad Sets: AROM;Left;10 reps Short Arc QuadBarbaraann Lawrence;Left;10 reps;Supine Heel Slides: AAROM;Left;10 reps Hip ABduction/ADduction: AAROM;Left;10 reps Straight Leg Raises: AAROM;Left;10 reps End of Session PT - End of Session Activity Tolerance: Patient tolerated treatment well Patient left: in chair;with call bell in reach Nurse Communication: Mobility status for transfers General Behavior During Session: [facility] for tasks performed Cognition: [facility] for tasks performed  Courtney Lawrence 05/17/2011, 1:56 PM

## 2011-05-17 NOTE — Progress Notes (Signed)
Subjective: 3 Days Post-Op Procedure(s) (LRB): TOTAL KNEE ARTHROPLASTY (Left)   Patient reports pain as mild. Progressing very slowly with PT, would benefit from additional help. Ready for discharge to SNF.  Objective:   VITALS:   Filed Vitals:   05/17/11 0530  BP: 147/81  Pulse: 92  Temp: 97.9 F (36.6 C)  Resp: 15   Weakness in the RLE Not able to straight leg raise without assistance  Neurovascular intact Dorsiflexion/Plantar flexion intact Incision: dressing C/D/I No cellulitis present Compartment soft  LABS No new labs   Assessment/Plan: 3 Days Post-Op Procedure(s) (LRB): TOTAL KNEE ARTHROPLASTY (Left)   Up with therapy Discharge to SNF today Follow up in 2 weeks at [facility].  Follow-up Information    Follow up with OLIN,Trina Asch D in 2 weeks.   Contact information:   [facility] [address] [place] [address] [phone]          Anastasio Auerbach. Leslee Haueter   PAC  05/17/2011, 8:48 AM

## 2011-05-23 DIAGNOSIS — E876 Hypokalemia: Secondary | ICD-10-CM | POA: Diagnosis not present

## 2011-05-23 DIAGNOSIS — M1710 Unilateral primary osteoarthritis, unspecified knee: Secondary | ICD-10-CM | POA: Diagnosis not present

## 2011-05-23 DIAGNOSIS — D62 Acute posthemorrhagic anemia: Secondary | ICD-10-CM | POA: Diagnosis not present

## 2011-05-23 DIAGNOSIS — M25569 Pain in unspecified knee: Secondary | ICD-10-CM | POA: Diagnosis not present

## 2011-05-23 DIAGNOSIS — R197 Diarrhea, unspecified: Secondary | ICD-10-CM | POA: Diagnosis not present

## 2011-05-25 DIAGNOSIS — E876 Hypokalemia: Secondary | ICD-10-CM | POA: Diagnosis not present

## 2011-05-25 DIAGNOSIS — D649 Anemia, unspecified: Secondary | ICD-10-CM | POA: Diagnosis not present

## 2011-05-25 DIAGNOSIS — M1990 Unspecified osteoarthritis, unspecified site: Secondary | ICD-10-CM | POA: Diagnosis not present

## 2011-05-25 DIAGNOSIS — G43811 Other migraine, intractable, with status migrainosus: Secondary | ICD-10-CM | POA: Diagnosis not present

## 2011-05-30 ENCOUNTER — Encounter (HOSPITAL_COMMUNITY): Payer: Self-pay | Admitting: Orthopedic Surgery

## 2011-05-30 DIAGNOSIS — Z96659 Presence of unspecified artificial knee joint: Secondary | ICD-10-CM | POA: Diagnosis not present

## 2011-05-30 DIAGNOSIS — J309 Allergic rhinitis, unspecified: Secondary | ICD-10-CM | POA: Diagnosis not present

## 2011-05-30 DIAGNOSIS — G43909 Migraine, unspecified, not intractable, without status migrainosus: Secondary | ICD-10-CM | POA: Diagnosis not present

## 2011-05-30 DIAGNOSIS — M1710 Unilateral primary osteoarthritis, unspecified knee: Secondary | ICD-10-CM | POA: Diagnosis not present

## 2011-05-30 DIAGNOSIS — IMO0002 Reserved for concepts with insufficient information to code with codable children: Secondary | ICD-10-CM | POA: Diagnosis not present

## 2011-05-30 DIAGNOSIS — Z471 Aftercare following joint replacement surgery: Secondary | ICD-10-CM | POA: Diagnosis not present

## 2011-05-31 DIAGNOSIS — G43909 Migraine, unspecified, not intractable, without status migrainosus: Secondary | ICD-10-CM | POA: Diagnosis not present

## 2011-05-31 DIAGNOSIS — Z96659 Presence of unspecified artificial knee joint: Secondary | ICD-10-CM | POA: Diagnosis not present

## 2011-05-31 DIAGNOSIS — J309 Allergic rhinitis, unspecified: Secondary | ICD-10-CM | POA: Diagnosis not present

## 2011-05-31 DIAGNOSIS — Z471 Aftercare following joint replacement surgery: Secondary | ICD-10-CM | POA: Diagnosis not present

## 2011-06-01 DIAGNOSIS — Z471 Aftercare following joint replacement surgery: Secondary | ICD-10-CM | POA: Diagnosis not present

## 2011-06-01 DIAGNOSIS — J309 Allergic rhinitis, unspecified: Secondary | ICD-10-CM | POA: Diagnosis not present

## 2011-06-01 DIAGNOSIS — G43909 Migraine, unspecified, not intractable, without status migrainosus: Secondary | ICD-10-CM | POA: Diagnosis not present

## 2011-06-01 DIAGNOSIS — Z96659 Presence of unspecified artificial knee joint: Secondary | ICD-10-CM | POA: Diagnosis not present

## 2011-06-04 DIAGNOSIS — J309 Allergic rhinitis, unspecified: Secondary | ICD-10-CM | POA: Diagnosis not present

## 2011-06-04 DIAGNOSIS — Z96659 Presence of unspecified artificial knee joint: Secondary | ICD-10-CM | POA: Diagnosis not present

## 2011-06-04 DIAGNOSIS — G43909 Migraine, unspecified, not intractable, without status migrainosus: Secondary | ICD-10-CM | POA: Diagnosis not present

## 2011-06-04 DIAGNOSIS — Z471 Aftercare following joint replacement surgery: Secondary | ICD-10-CM | POA: Diagnosis not present

## 2011-06-05 DIAGNOSIS — Z96659 Presence of unspecified artificial knee joint: Secondary | ICD-10-CM | POA: Diagnosis not present

## 2011-06-05 DIAGNOSIS — G43909 Migraine, unspecified, not intractable, without status migrainosus: Secondary | ICD-10-CM | POA: Diagnosis not present

## 2011-06-05 DIAGNOSIS — J309 Allergic rhinitis, unspecified: Secondary | ICD-10-CM | POA: Diagnosis not present

## 2011-06-05 DIAGNOSIS — Z471 Aftercare following joint replacement surgery: Secondary | ICD-10-CM | POA: Diagnosis not present

## 2011-06-06 DIAGNOSIS — Z471 Aftercare following joint replacement surgery: Secondary | ICD-10-CM | POA: Diagnosis not present

## 2011-06-06 DIAGNOSIS — G43909 Migraine, unspecified, not intractable, without status migrainosus: Secondary | ICD-10-CM | POA: Diagnosis not present

## 2011-06-06 DIAGNOSIS — J309 Allergic rhinitis, unspecified: Secondary | ICD-10-CM | POA: Diagnosis not present

## 2011-06-06 DIAGNOSIS — Z96659 Presence of unspecified artificial knee joint: Secondary | ICD-10-CM | POA: Diagnosis not present

## 2011-06-07 DIAGNOSIS — Z96659 Presence of unspecified artificial knee joint: Secondary | ICD-10-CM | POA: Diagnosis not present

## 2011-06-07 DIAGNOSIS — Z471 Aftercare following joint replacement surgery: Secondary | ICD-10-CM | POA: Diagnosis not present

## 2011-06-07 DIAGNOSIS — G43909 Migraine, unspecified, not intractable, without status migrainosus: Secondary | ICD-10-CM | POA: Diagnosis not present

## 2011-06-07 DIAGNOSIS — J309 Allergic rhinitis, unspecified: Secondary | ICD-10-CM | POA: Diagnosis not present

## 2011-06-08 DIAGNOSIS — G43909 Migraine, unspecified, not intractable, without status migrainosus: Secondary | ICD-10-CM | POA: Diagnosis not present

## 2011-06-08 DIAGNOSIS — Z471 Aftercare following joint replacement surgery: Secondary | ICD-10-CM | POA: Diagnosis not present

## 2011-06-08 DIAGNOSIS — J309 Allergic rhinitis, unspecified: Secondary | ICD-10-CM | POA: Diagnosis not present

## 2011-06-08 DIAGNOSIS — Z96659 Presence of unspecified artificial knee joint: Secondary | ICD-10-CM | POA: Diagnosis not present

## 2011-06-11 DIAGNOSIS — Z471 Aftercare following joint replacement surgery: Secondary | ICD-10-CM | POA: Diagnosis not present

## 2011-06-11 DIAGNOSIS — Z96659 Presence of unspecified artificial knee joint: Secondary | ICD-10-CM | POA: Diagnosis not present

## 2011-06-11 DIAGNOSIS — G43909 Migraine, unspecified, not intractable, without status migrainosus: Secondary | ICD-10-CM | POA: Diagnosis not present

## 2011-06-11 DIAGNOSIS — J309 Allergic rhinitis, unspecified: Secondary | ICD-10-CM | POA: Diagnosis not present

## 2011-06-13 DIAGNOSIS — J309 Allergic rhinitis, unspecified: Secondary | ICD-10-CM | POA: Diagnosis not present

## 2011-06-13 DIAGNOSIS — Z96659 Presence of unspecified artificial knee joint: Secondary | ICD-10-CM | POA: Diagnosis not present

## 2011-06-13 DIAGNOSIS — G43909 Migraine, unspecified, not intractable, without status migrainosus: Secondary | ICD-10-CM | POA: Diagnosis not present

## 2011-06-13 DIAGNOSIS — Z471 Aftercare following joint replacement surgery: Secondary | ICD-10-CM | POA: Diagnosis not present

## 2011-06-14 DIAGNOSIS — G43909 Migraine, unspecified, not intractable, without status migrainosus: Secondary | ICD-10-CM | POA: Diagnosis not present

## 2011-06-14 DIAGNOSIS — J309 Allergic rhinitis, unspecified: Secondary | ICD-10-CM | POA: Diagnosis not present

## 2011-06-14 DIAGNOSIS — Z96659 Presence of unspecified artificial knee joint: Secondary | ICD-10-CM | POA: Diagnosis not present

## 2011-06-14 DIAGNOSIS — Z471 Aftercare following joint replacement surgery: Secondary | ICD-10-CM | POA: Diagnosis not present

## 2011-06-18 DIAGNOSIS — J309 Allergic rhinitis, unspecified: Secondary | ICD-10-CM | POA: Diagnosis not present

## 2011-06-18 DIAGNOSIS — Z471 Aftercare following joint replacement surgery: Secondary | ICD-10-CM | POA: Diagnosis not present

## 2011-06-18 DIAGNOSIS — Z96659 Presence of unspecified artificial knee joint: Secondary | ICD-10-CM | POA: Diagnosis not present

## 2011-06-18 DIAGNOSIS — G43909 Migraine, unspecified, not intractable, without status migrainosus: Secondary | ICD-10-CM | POA: Diagnosis not present

## 2011-06-20 DIAGNOSIS — G43909 Migraine, unspecified, not intractable, without status migrainosus: Secondary | ICD-10-CM | POA: Diagnosis not present

## 2011-06-20 DIAGNOSIS — J309 Allergic rhinitis, unspecified: Secondary | ICD-10-CM | POA: Diagnosis not present

## 2011-06-20 DIAGNOSIS — Z96659 Presence of unspecified artificial knee joint: Secondary | ICD-10-CM | POA: Diagnosis not present

## 2011-06-20 DIAGNOSIS — Z471 Aftercare following joint replacement surgery: Secondary | ICD-10-CM | POA: Diagnosis not present

## 2011-06-22 DIAGNOSIS — G43909 Migraine, unspecified, not intractable, without status migrainosus: Secondary | ICD-10-CM | POA: Diagnosis not present

## 2011-06-22 DIAGNOSIS — Z471 Aftercare following joint replacement surgery: Secondary | ICD-10-CM | POA: Diagnosis not present

## 2011-06-22 DIAGNOSIS — Z96659 Presence of unspecified artificial knee joint: Secondary | ICD-10-CM | POA: Diagnosis not present

## 2011-06-22 DIAGNOSIS — J309 Allergic rhinitis, unspecified: Secondary | ICD-10-CM | POA: Diagnosis not present

## 2011-06-25 DIAGNOSIS — Z471 Aftercare following joint replacement surgery: Secondary | ICD-10-CM | POA: Diagnosis not present

## 2011-06-25 DIAGNOSIS — Z96659 Presence of unspecified artificial knee joint: Secondary | ICD-10-CM | POA: Diagnosis not present

## 2011-06-25 DIAGNOSIS — G43909 Migraine, unspecified, not intractable, without status migrainosus: Secondary | ICD-10-CM | POA: Diagnosis not present

## 2011-06-25 DIAGNOSIS — J309 Allergic rhinitis, unspecified: Secondary | ICD-10-CM | POA: Diagnosis not present

## 2011-06-27 DIAGNOSIS — M1710 Unilateral primary osteoarthritis, unspecified knee: Secondary | ICD-10-CM | POA: Diagnosis not present

## 2011-06-27 DIAGNOSIS — IMO0002 Reserved for concepts with insufficient information to code with codable children: Secondary | ICD-10-CM | POA: Diagnosis not present

## 2011-06-29 DIAGNOSIS — M1710 Unilateral primary osteoarthritis, unspecified knee: Secondary | ICD-10-CM | POA: Diagnosis not present

## 2011-06-29 DIAGNOSIS — IMO0002 Reserved for concepts with insufficient information to code with codable children: Secondary | ICD-10-CM | POA: Diagnosis not present

## 2011-07-02 DIAGNOSIS — M1710 Unilateral primary osteoarthritis, unspecified knee: Secondary | ICD-10-CM | POA: Diagnosis not present

## 2011-07-02 DIAGNOSIS — IMO0002 Reserved for concepts with insufficient information to code with codable children: Secondary | ICD-10-CM | POA: Diagnosis not present

## 2011-07-06 DIAGNOSIS — M1710 Unilateral primary osteoarthritis, unspecified knee: Secondary | ICD-10-CM | POA: Diagnosis not present

## 2011-07-06 DIAGNOSIS — IMO0002 Reserved for concepts with insufficient information to code with codable children: Secondary | ICD-10-CM | POA: Diagnosis not present

## 2011-07-09 DIAGNOSIS — IMO0002 Reserved for concepts with insufficient information to code with codable children: Secondary | ICD-10-CM | POA: Diagnosis not present

## 2011-07-09 DIAGNOSIS — M1710 Unilateral primary osteoarthritis, unspecified knee: Secondary | ICD-10-CM | POA: Diagnosis not present

## 2011-07-11 DIAGNOSIS — M1710 Unilateral primary osteoarthritis, unspecified knee: Secondary | ICD-10-CM | POA: Diagnosis not present

## 2011-07-11 DIAGNOSIS — IMO0002 Reserved for concepts with insufficient information to code with codable children: Secondary | ICD-10-CM | POA: Diagnosis not present

## 2011-07-13 DIAGNOSIS — IMO0002 Reserved for concepts with insufficient information to code with codable children: Secondary | ICD-10-CM | POA: Diagnosis not present

## 2011-07-13 DIAGNOSIS — M1710 Unilateral primary osteoarthritis, unspecified knee: Secondary | ICD-10-CM | POA: Diagnosis not present

## 2011-07-16 DIAGNOSIS — IMO0002 Reserved for concepts with insufficient information to code with codable children: Secondary | ICD-10-CM | POA: Diagnosis not present

## 2011-07-16 DIAGNOSIS — M1710 Unilateral primary osteoarthritis, unspecified knee: Secondary | ICD-10-CM | POA: Diagnosis not present

## 2011-07-19 DIAGNOSIS — M1710 Unilateral primary osteoarthritis, unspecified knee: Secondary | ICD-10-CM | POA: Diagnosis not present

## 2011-07-19 DIAGNOSIS — IMO0002 Reserved for concepts with insufficient information to code with codable children: Secondary | ICD-10-CM | POA: Diagnosis not present

## 2011-07-23 DIAGNOSIS — IMO0002 Reserved for concepts with insufficient information to code with codable children: Secondary | ICD-10-CM | POA: Diagnosis not present

## 2011-07-23 DIAGNOSIS — M1710 Unilateral primary osteoarthritis, unspecified knee: Secondary | ICD-10-CM | POA: Diagnosis not present

## 2011-07-26 DIAGNOSIS — M1710 Unilateral primary osteoarthritis, unspecified knee: Secondary | ICD-10-CM | POA: Diagnosis not present

## 2011-07-26 DIAGNOSIS — IMO0002 Reserved for concepts with insufficient information to code with codable children: Secondary | ICD-10-CM | POA: Diagnosis not present

## 2011-08-01 DIAGNOSIS — IMO0002 Reserved for concepts with insufficient information to code with codable children: Secondary | ICD-10-CM | POA: Diagnosis not present

## 2011-08-01 DIAGNOSIS — M1710 Unilateral primary osteoarthritis, unspecified knee: Secondary | ICD-10-CM | POA: Diagnosis not present

## 2011-08-03 DIAGNOSIS — M1710 Unilateral primary osteoarthritis, unspecified knee: Secondary | ICD-10-CM | POA: Diagnosis not present

## 2011-08-03 DIAGNOSIS — IMO0002 Reserved for concepts with insufficient information to code with codable children: Secondary | ICD-10-CM | POA: Diagnosis not present

## 2011-08-06 DIAGNOSIS — IMO0002 Reserved for concepts with insufficient information to code with codable children: Secondary | ICD-10-CM | POA: Diagnosis not present

## 2011-08-06 DIAGNOSIS — M1710 Unilateral primary osteoarthritis, unspecified knee: Secondary | ICD-10-CM | POA: Diagnosis not present

## 2011-08-10 DIAGNOSIS — IMO0002 Reserved for concepts with insufficient information to code with codable children: Secondary | ICD-10-CM | POA: Diagnosis not present

## 2011-08-10 DIAGNOSIS — M1710 Unilateral primary osteoarthritis, unspecified knee: Secondary | ICD-10-CM | POA: Diagnosis not present

## 2011-08-13 DIAGNOSIS — M1710 Unilateral primary osteoarthritis, unspecified knee: Secondary | ICD-10-CM | POA: Diagnosis not present

## 2011-08-13 DIAGNOSIS — IMO0002 Reserved for concepts with insufficient information to code with codable children: Secondary | ICD-10-CM | POA: Diagnosis not present

## 2011-08-15 DIAGNOSIS — M1710 Unilateral primary osteoarthritis, unspecified knee: Secondary | ICD-10-CM | POA: Diagnosis not present

## 2011-08-15 DIAGNOSIS — IMO0002 Reserved for concepts with insufficient information to code with codable children: Secondary | ICD-10-CM | POA: Diagnosis not present

## 2011-08-23 DIAGNOSIS — M1710 Unilateral primary osteoarthritis, unspecified knee: Secondary | ICD-10-CM | POA: Diagnosis not present

## 2011-08-23 DIAGNOSIS — IMO0002 Reserved for concepts with insufficient information to code with codable children: Secondary | ICD-10-CM | POA: Diagnosis not present

## 2011-08-31 DIAGNOSIS — H903 Sensorineural hearing loss, bilateral: Secondary | ICD-10-CM | POA: Diagnosis not present

## 2011-12-11 DIAGNOSIS — Z23 Encounter for immunization: Secondary | ICD-10-CM | POA: Diagnosis not present

## 2012-02-13 DIAGNOSIS — Z96659 Presence of unspecified artificial knee joint: Secondary | ICD-10-CM | POA: Diagnosis not present

## 2012-03-10 DIAGNOSIS — D485 Neoplasm of uncertain behavior of skin: Secondary | ICD-10-CM | POA: Diagnosis not present

## 2012-03-10 DIAGNOSIS — L821 Other seborrheic keratosis: Secondary | ICD-10-CM | POA: Diagnosis not present

## 2012-03-10 DIAGNOSIS — L570 Actinic keratosis: Secondary | ICD-10-CM | POA: Diagnosis not present

## 2012-03-27 DIAGNOSIS — Z800 Family history of malignant neoplasm of digestive organs: Secondary | ICD-10-CM | POA: Diagnosis not present

## 2012-03-27 DIAGNOSIS — M899 Disorder of bone, unspecified: Secondary | ICD-10-CM | POA: Diagnosis not present

## 2012-03-27 DIAGNOSIS — Z0000 Encounter for general adult medical examination without abnormal findings: Secondary | ICD-10-CM | POA: Diagnosis not present

## 2012-03-27 DIAGNOSIS — M159 Polyosteoarthritis, unspecified: Secondary | ICD-10-CM | POA: Diagnosis not present

## 2012-03-27 DIAGNOSIS — Z131 Encounter for screening for diabetes mellitus: Secondary | ICD-10-CM | POA: Diagnosis not present

## 2012-03-27 DIAGNOSIS — G43909 Migraine, unspecified, not intractable, without status migrainosus: Secondary | ICD-10-CM | POA: Diagnosis not present

## 2012-03-27 DIAGNOSIS — M949 Disorder of cartilage, unspecified: Secondary | ICD-10-CM | POA: Diagnosis not present

## 2012-06-18 ENCOUNTER — Other Ambulatory Visit (HOSPITAL_COMMUNITY): Payer: Self-pay | Admitting: Family Medicine

## 2012-06-18 DIAGNOSIS — Z1231 Encounter for screening mammogram for malignant neoplasm of breast: Secondary | ICD-10-CM

## 2012-06-26 DIAGNOSIS — M949 Disorder of cartilage, unspecified: Secondary | ICD-10-CM | POA: Diagnosis not present

## 2012-06-26 DIAGNOSIS — M899 Disorder of bone, unspecified: Secondary | ICD-10-CM | POA: Diagnosis not present

## 2012-07-02 ENCOUNTER — Ambulatory Visit (HOSPITAL_COMMUNITY)
Admission: RE | Admit: 2012-07-02 | Discharge: 2012-07-02 | Disposition: A | Discharge status: Home / Self Care | Payer: Medicare Other | Source: Ambulatory Visit | Attending: Family Medicine | Admitting: Family Medicine

## 2012-07-02 DIAGNOSIS — Z1231 Encounter for screening mammogram for malignant neoplasm of breast: Secondary | ICD-10-CM | POA: Insufficient documentation

## 2012-08-12 DIAGNOSIS — E559 Vitamin D deficiency, unspecified: Secondary | ICD-10-CM | POA: Diagnosis not present

## 2012-08-19 DIAGNOSIS — M810 Age-related osteoporosis without current pathological fracture: Secondary | ICD-10-CM | POA: Diagnosis not present

## 2012-12-24 DIAGNOSIS — Z23 Encounter for immunization: Secondary | ICD-10-CM | POA: Diagnosis not present

## 2012-12-30 DIAGNOSIS — E559 Vitamin D deficiency, unspecified: Secondary | ICD-10-CM | POA: Diagnosis not present

## 2013-02-20 DIAGNOSIS — M899 Disorder of bone, unspecified: Secondary | ICD-10-CM | POA: Diagnosis not present

## 2013-03-30 DIAGNOSIS — Z800 Family history of malignant neoplasm of digestive organs: Secondary | ICD-10-CM | POA: Diagnosis not present

## 2013-03-30 DIAGNOSIS — M899 Disorder of bone, unspecified: Secondary | ICD-10-CM | POA: Diagnosis not present

## 2013-03-30 DIAGNOSIS — Z0000 Encounter for general adult medical examination without abnormal findings: Secondary | ICD-10-CM | POA: Diagnosis not present

## 2013-03-30 DIAGNOSIS — G43909 Migraine, unspecified, not intractable, without status migrainosus: Secondary | ICD-10-CM | POA: Diagnosis not present

## 2013-03-30 DIAGNOSIS — Z131 Encounter for screening for diabetes mellitus: Secondary | ICD-10-CM | POA: Diagnosis not present

## 2013-03-30 DIAGNOSIS — M159 Polyosteoarthritis, unspecified: Secondary | ICD-10-CM | POA: Diagnosis not present

## 2013-03-30 DIAGNOSIS — Z23 Encounter for immunization: Secondary | ICD-10-CM | POA: Diagnosis not present

## 2013-03-30 DIAGNOSIS — Z136 Encounter for screening for cardiovascular disorders: Secondary | ICD-10-CM | POA: Diagnosis not present

## 2013-03-30 DIAGNOSIS — M949 Disorder of cartilage, unspecified: Secondary | ICD-10-CM | POA: Diagnosis not present

## 2013-05-01 ENCOUNTER — Encounter: Payer: Self-pay | Admitting: Internal Medicine

## 2013-05-29 ENCOUNTER — Other Ambulatory Visit (HOSPITAL_COMMUNITY): Payer: Self-pay | Admitting: Family Medicine

## 2013-05-29 DIAGNOSIS — Z1231 Encounter for screening mammogram for malignant neoplasm of breast: Secondary | ICD-10-CM

## 2013-06-18 ENCOUNTER — Encounter: Payer: Self-pay | Admitting: Internal Medicine

## 2013-06-18 ENCOUNTER — Ambulatory Visit: Payer: Medicare Other | Admitting: Internal Medicine

## 2013-06-18 ENCOUNTER — Ambulatory Visit (INDEPENDENT_AMBULATORY_CARE_PROVIDER_SITE_OTHER): Payer: Medicare Other | Admitting: Internal Medicine

## 2013-06-18 VITALS — BP 124/80 | HR 88 | Ht 63.0 in | Wt 180.2 lb

## 2013-06-18 DIAGNOSIS — Z800 Family history of malignant neoplasm of digestive organs: Secondary | ICD-10-CM

## 2013-06-18 DIAGNOSIS — R143 Flatulence: Secondary | ICD-10-CM | POA: Diagnosis not present

## 2013-06-18 DIAGNOSIS — R141 Gas pain: Secondary | ICD-10-CM | POA: Diagnosis not present

## 2013-06-18 DIAGNOSIS — R142 Eructation: Secondary | ICD-10-CM

## 2013-06-18 DIAGNOSIS — K5900 Constipation, unspecified: Secondary | ICD-10-CM

## 2013-06-18 DIAGNOSIS — R140 Abdominal distension (gaseous): Secondary | ICD-10-CM

## 2013-06-18 HISTORY — DX: Family history of malignant neoplasm of digestive organs: Z80.0

## 2013-06-18 NOTE — Patient Instructions (Addendum)
Call us back in June/July to set up a pre-visit, colonoscopy appointments for August.  Hopefully we will have the calendar open by then.   I appreciate the opportunity to care for you.

## 2013-06-18 NOTE — Progress Notes (Signed)
Subjective:    Patient ID: Courtney Lawrence, female    DOB: 17-Dec-1940, 73 y.o.   MRN: [id number]  HPI As is a very pleasant elderly woman with a family history of colon cancer in a sister and father. Both of them were in her 28s, the sister presented to stage IV disease and did not survive long, her father survived his colon cancer. She has had routine colonoscopy at least once, last being in August of 2010. Her prior gastroenterologist retired. Her daughter is a patient of mine, Leveda Anna Dovskin) and recommended me. The patient did have some recent problems with constipation and bloating. She changed over vitamin supplements, she stops fish oil and that has resolved.  Her previous gastroenterologist did recommend testing for Lynch syndrome which she did not do. There really is not an overlapping history of multi-generations of colon cancer or genitourinary cancer though a grandfather did have breast cancer.  Allergies  Allergen Reactions  . Aspirin Hives and Other (See Comments)    Headache   . Codeine   . Penicillins   . Platinum-Containing Compounds   . Yellow Dyes (Non-Tartrazine) Hives  . Nickel Rash    GETS "INFECTION" WITH NICKEL EARRINGS   Outpatient Prescriptions Prior to Visit  Medication Sig Dispense Refill  . CALCIUM-MAGNESIUM-VITAMIN D PO Take 1 tablet by mouth 2 (two) times daily.      . fish oil-omega-3 fatty acids 1000 MG capsule Take 1 g by mouth daily.      Marland Kitchen glucosamine-chondroitin 500-400 MG tablet Take 1 tablet by mouth 2 (two) times daily.      . Multiple Vitamins-Minerals (ICAPS PO) Take 1 tablet by mouth daily.      . SUMAtriptan (IMITREX) 100 MG tablet Take 50 mg by mouth every 2 (two) hours as needed. Headache      . rivaroxaban (XARELTO) 10 MG TABS tablet Take 1 tablet (10 mg total) by mouth daily.  12 tablet  0   No facility-administered medications prior to visit.   Past Medical History  Diagnosis Date  . Seasonal allergies   . Migraine   . DJD  (degenerative joint disease)   . Urticaria, chronic   . Osteopenia   . Diverticulosis 10/28/2008   Past Surgical History  Procedure Laterality Date  . Foot surgery Right 2012 ALSO 2003  . Total knee arthroplasty  05/14/2011    Procedure: TOTAL KNEE ARTHROPLASTY;  Surgeon: Mauri Pole, MD;  Location: [facility];  Service: Orthopedics;  Laterality: Left;  . Colonoscopy  10/28/2008    Dr. Franki Cabot   History   Social History  . Marital Status: Married    Spouse Name: N/A    Number of Children: N/A  . Years of Education: N/A   Occupational History  . president [employer]    Social History Main Topics  . Smoking status: Never Smoker   . Smokeless tobacco: Never Used  . Alcohol Use: Yes     Comment: OCCASIONAL  . 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 One son to daughters. 2-3 caffeinated drinks a day.   Family History  Problem Relation Age of Onset  . Colon cancer Father   . Heart attack Father   . Coronary artery disease Father   . Rheum arthritis Mother   . Diabetes Paternal Grandfather   . Breast cancer Maternal Grandfather   . Colon cancer Sister  Review of Systems As per history of present illness, also having hearing difficulty and she wears a hearing aid.    Objective:   Physical Exam General:  NAD Eyes:   anicteric Lungs:  clear Heart:  S1S2 no rubs, murmurs or gallops Abdomen:  soft and nontender, BS+ Ext:   no edema  Data Reviewed:  2010 colonoscopy Dr. Valli Glance - mild diverticulosis and internal hemorrhoids otherwise normal. I reviewed her primary care notes, she had a CBC in 2012 it was normal. Comprehensive metabolic panel normal then also.     Assessment & Plan:   1. Unspecified constipation   2. Bloating   3. Family history of colon cancer in sister and father (30's)    Numbers 1 and 2 seem to be resolved with her own intervention. Screening colonoscopy is appropriate every 5 years. I'm  not convinced that screening her for Lynch syndrome made in the sense and she decided not to do that anyway. Will repeat a colonoscopy in August of this year the plan. She will come and see the nurse for a pre-visit prior to her colonoscopy approximately in August.  I appreciate the opportunity to care for this patient. CC: Osborne Casco, MD

## 2013-07-08 ENCOUNTER — Ambulatory Visit (HOSPITAL_COMMUNITY)
Admission: RE | Admit: 2013-07-08 | Discharge: 2013-07-08 | Disposition: A | Discharge status: Home / Self Care | Payer: Medicare Other | Source: Ambulatory Visit | Attending: Family Medicine | Admitting: Family Medicine

## 2013-07-08 DIAGNOSIS — Z1231 Encounter for screening mammogram for malignant neoplasm of breast: Secondary | ICD-10-CM | POA: Diagnosis not present

## 2013-07-24 ENCOUNTER — Encounter: Payer: Self-pay | Admitting: Internal Medicine

## 2013-07-31 DIAGNOSIS — S61209A Unspecified open wound of unspecified finger without damage to nail, initial encounter: Secondary | ICD-10-CM | POA: Diagnosis not present

## 2013-08-05 DIAGNOSIS — L821 Other seborrheic keratosis: Secondary | ICD-10-CM | POA: Diagnosis not present

## 2013-08-05 DIAGNOSIS — D1801 Hemangioma of skin and subcutaneous tissue: Secondary | ICD-10-CM | POA: Diagnosis not present

## 2013-08-10 DIAGNOSIS — Z4802 Encounter for removal of sutures: Secondary | ICD-10-CM | POA: Diagnosis not present

## 2013-08-10 DIAGNOSIS — L905 Scar conditions and fibrosis of skin: Secondary | ICD-10-CM | POA: Diagnosis not present

## 2013-08-24 DIAGNOSIS — M899 Disorder of bone, unspecified: Secondary | ICD-10-CM | POA: Diagnosis not present

## 2013-08-24 DIAGNOSIS — M949 Disorder of cartilage, unspecified: Secondary | ICD-10-CM | POA: Diagnosis not present

## 2013-10-07 DIAGNOSIS — E785 Hyperlipidemia, unspecified: Secondary | ICD-10-CM | POA: Diagnosis not present

## 2013-10-12 ENCOUNTER — Ambulatory Visit (AMBULATORY_SURGERY_CENTER): Payer: Self-pay | Admitting: *Deleted

## 2013-10-12 VITALS — Ht 64.0 in | Wt 185.2 lb

## 2013-10-12 DIAGNOSIS — Z800 Family history of malignant neoplasm of digestive organs: Secondary | ICD-10-CM

## 2013-10-12 MED ORDER — NA SULFATE-K SULFATE-MG SULF 17.5-3.13-1.6 GM/177ML PO SOLN: ORAL | Status: DC

## 2013-10-12 NOTE — Progress Notes (Signed)
No allergies to eggs or soy. No problems with anesthesia.  Pt given Emmi instructions for colonoscopy  No oxygen use  No diet drug use  

## 2013-10-30 ENCOUNTER — Ambulatory Visit (AMBULATORY_SURGERY_CENTER): Payer: Medicare Other | Admitting: Internal Medicine

## 2013-10-30 ENCOUNTER — Encounter: Payer: Self-pay | Admitting: Internal Medicine

## 2013-10-30 VITALS — BP 106/72 | HR 68 | Temp 98.1°F | Resp 16 | Ht 64.0 in | Wt 185.0 lb

## 2013-10-30 DIAGNOSIS — Z800 Family history of malignant neoplasm of digestive organs: Secondary | ICD-10-CM

## 2013-10-30 DIAGNOSIS — Z1211 Encounter for screening for malignant neoplasm of colon: Secondary | ICD-10-CM | POA: Diagnosis not present

## 2013-10-30 DIAGNOSIS — D126 Benign neoplasm of colon, unspecified: Secondary | ICD-10-CM

## 2013-10-30 DIAGNOSIS — D123 Benign neoplasm of transverse colon: Secondary | ICD-10-CM

## 2013-10-30 DIAGNOSIS — Z860100 Personal history of colon polyps, unspecified: Secondary | ICD-10-CM

## 2013-10-30 DIAGNOSIS — M1990 Unspecified osteoarthritis, unspecified site: Secondary | ICD-10-CM | POA: Diagnosis not present

## 2013-10-30 HISTORY — DX: Personal history of colon polyps, unspecified: Z86.0100

## 2013-10-30 HISTORY — DX: Personal history of colonic polyps: Z86.010

## 2013-10-30 MED ORDER — SODIUM CHLORIDE 0.9 % IV SOLN: 500.0000 mL | INTRAVENOUS | Status: DC

## 2013-10-30 NOTE — Progress Notes (Signed)
Report to PACU, RN, vss, BBS= Clear.  

## 2013-10-30 NOTE — Op Note (Signed)
[place]  [address] [employer] [address] [place], [id number]   COLONOSCOPY PROCEDURE REPORT  PATIENT: Courtney Lawrence, Courtney Lawrence  MR#: [id number] BIRTHDATE: 1940-05-04 , 73  yrs. old GENDER: Female ENDOSCOPIST: Gatha Mayer, MD, [facility] PROCEDURE DATE:  10/30/2013 PROCEDURE:   Colonoscopy with snare polypectomy First Screening Colonoscopy - Avg.  risk and is 50 yrs.  old or older - No.  Prior Negative Screening - Now for repeat screening. Above average risk  History of Adenoma - Now for follow-up colonoscopy & has been > or = to 3 yrs.  N/A  Polyps Removed Today? Yes. ASA CLASS:   Class II INDICATIONS:elevated risk screening and Patient's immediate family history of colon cancer. MEDICATIONS: Propofol (Diprivan) 230 mg IV, MAC sedation, administered by CRNA, and These medications were titrated to patient response per physician's verbal order  DESCRIPTION OF PROCEDURE:   After the risks benefits and alternatives of the procedure were thoroughly explained, informed consent was obtained.  A digital rectal exam revealed no abnormalities of the rectum.   The LB [id number]  endoscope was introduced through the anus and advanced to the cecum, which was identified by both the appendix and ileocecal valve. No adverse events experienced.   The quality of the prep was Suprep good  The instrument was then slowly withdrawn as the colon was fully examined.  COLON FINDINGS: A sessile polyp measuring 4 mm in size was found in the transverse colon.  A polypectomy was performed with a cold snare.  The resection was complete and the polyp tissue was completely retrieved.   Moderate diverticulosis was noted in the sigmoid colon.   The colon was redundant.  Manual abdominal counter-pressure was used to reach the cecum.   The colon mucosa was otherwise normal.  Retroflexed views revealed no abnormalities. The time to cecum=7 minutes 29 seconds.  Withdrawal time=8 minutes 41 seconds.  The scope was  withdrawn and the procedure completed. COMPLICATIONS: There were no complications.  ENDOSCOPIC IMPRESSION: 1.   Sessile polyp measuring 4 mm in size was found in the transverse colon; polypectomy was performed with a cold snare 2.   Moderate diverticulosis was noted in the sigmoid colon 3.   The colon was redundant 4.   The colon mucosa was otherwise normal - good prep - family hx CRCA sister and father  RECOMMENDATIONS: 1.  Timing of repeat colonoscopy will be determined by pathology findings. 2.   likely 5 years   eSigned:  Gatha Mayer, MD, [facility] 10/30/2013 9:29 AM  cc: The Patient and Kelton Pillar, MD

## 2013-10-30 NOTE — Patient Instructions (Addendum)
I found and removed one tiny polyp that looks benign. You also have a condition called diverticulosis - common and not usually a problem. Please read the handout provided.  I will let you know pathology results and when to have another routine colonoscopy by mail. Probably 5 years.  I appreciate the opportunity to care for you. Gatha Mayer, MD, FACG   YOU HAD AN ENDOSCOPIC PROCEDURE TODAY AT [facility]: Refer to the procedure report that was given to you for any specific questions about what was found during the examination.  If the procedure report does not answer your questions, please call your gastroenterologist to clarify.  If you requested that your care partner not be given the details of your procedure findings, then the procedure report has been included in a sealed envelope for you to review at your convenience later.  YOU SHOULD EXPECT: Some feelings of bloating in the abdomen. Passage of more gas than usual.  Walking can help get rid of the air that was put into your GI tract during the procedure and reduce the bloating. If you had a lower endoscopy (such as a colonoscopy or flexible sigmoidoscopy) you may notice spotting of blood in your stool or on the toilet paper. If you underwent a bowel prep for your procedure, then you may not have a normal bowel movement for a few days.  DIET: Your first meal following the procedure should be a light meal and then it is ok to progress to your normal diet.  A half-sandwich or bowl of soup is an example of a good first meal.  Heavy or fried foods are harder to digest and may make you feel nauseous or bloated.  Likewise meals heavy in dairy and vegetables can cause extra gas to form and this can also increase the bloating.  Drink plenty of fluids but you should avoid alcoholic beverages for 24 hours.  ACTIVITY: Your care partner should take you home directly after the procedure.  You should plan to take it easy, moving slowly for  the rest of the day.  You can resume normal activity the day after the procedure however you should NOT DRIVE or use heavy machinery for 24 hours (because of the sedation medicines used during the test).    SYMPTOMS TO REPORT IMMEDIATELY: A gastroenterologist can be reached at any hour.  During normal business hours, 8:30 AM to 5:00 PM Monday through Friday, call [phone].  After hours and on weekends, please call the GI answering service at [phone] who will take a message and have the physician on call contact you.   Following lower endoscopy (colonoscopy or flexible sigmoidoscopy):  Excessive amounts of blood in the stool  Significant tenderness or worsening of abdominal pains  Swelling of the abdomen that is new, acute  Fever of 100F or higher  FOLLOW UP: If any biopsies were taken you will be contacted by phone or by letter within the next 1-3 weeks.  Call your gastroenterologist if you have not heard about the biopsies in 3 weeks.  Our staff will call the home number listed on your records the next business day following your procedure to check on you and address any questions or concerns that you may have at that time regarding the information given to you following your procedure. This is a courtesy call and so if there is no answer at the home number and we have not heard from you through the emergency physician on  call, we will assume that you have returned to your regular daily activities without incident.  SIGNATURES/CONFIDENTIALITY: You and/or your care partner have signed paperwork which will be entered into your electronic medical record.  These signatures attest to the fact that that the information above on your After Visit Summary has been reviewed and is understood.  Full responsibility of the confidentiality of this discharge information lies with you and/or your care-partner.  Diverticulosis Diverticulosis is the condition that develops when small pouches  (diverticula) form in the wall of your colon. Your colon, or large intestine, is where water is absorbed and stool is formed. The pouches form when the inside layer of your colon pushes through weak spots in the outer layers of your colon. CAUSES  No one knows exactly what causes diverticulosis. RISK FACTORS  Being older than 76. Your risk for this condition increases with age. Diverticulosis is rare in people younger than 40 years. By age 79, almost everyone has it.  Eating a low-fiber diet.  Being frequently constipated.  Being overweight.  Not getting enough exercise.  Smoking.  Taking over-the-counter pain medicines, like aspirin and ibuprofen. SYMPTOMS  Most people with diverticulosis do not have symptoms. DIAGNOSIS  Because diverticulosis often has no symptoms, health care providers often discover the condition during an exam for other colon problems. In many cases, a health care provider will diagnose diverticulosis while using a flexible scope to examine the colon (colonoscopy). TREATMENT  If you have never developed an infection related to diverticulosis, you may not need treatment. If you have had an infection before, treatment may include:  Eating more fruits, vegetables, and grains.  Taking a fiber supplement.  Taking a live bacteria supplement (probiotic).  Taking medicine to relax your colon. HOME CARE INSTRUCTIONS   Drink at least 6-8 glasses of water each day to prevent constipation.  Try not to strain when you have a bowel movement.  Keep all follow-up appointments. If you have had an infection before:  Increase the fiber in your diet as directed by your health care provider or dietitian.  Take a dietary fiber supplement if your health care provider approves.  Only take medicines as directed by your health care provider. SEEK MEDICAL CARE IF:   You have abdominal pain.  You have bloating.  You have cramps.  You have not gone to the bathroom in 3  days. SEEK IMMEDIATE MEDICAL CARE IF:   Your pain gets worse.  Yourbloating becomes very bad.  You have a fever or chills, and your symptoms suddenly get worse.  You begin vomiting.  You have bowel movements that are bloody or black. MAKE SURE YOU:  Understand these instructions.  Will watch your condition.  Will get help right away if you are not doing well or get worse. Document Released: 11/17/2003 Document Revised: 02/24/2013 Document Reviewed: 01/14/2013 [facility] Patient Information 2015 [place], [place]. This information is not intended to replace advice given to you by your health care provider. Make sure you discuss any questions you have with your health care provider.

## 2013-10-30 NOTE — Progress Notes (Signed)
Called to room to assist during endoscopic procedure.  Patient ID and intended procedure confirmed with present staff. Received instructions for my participation in the procedure from the performing physician.  

## 2013-10-30 NOTE — Progress Notes (Signed)
Patient in recliner room with husband and hot tea. Patient checked by Dr. Carlean Purl and [facility] Monday CRNA prior to leaving. Patient stating she is much improved.

## 2013-11-02 ENCOUNTER — Telehealth: Payer: Self-pay | Admitting: *Deleted

## 2013-11-02 NOTE — Telephone Encounter (Signed)
Santiago Glad,  Thank you, I have referred this matter to [facility] Monday CRNA, MS and he will phone the pt.  Thanks,  Jenny Reichmann

## 2013-11-02 NOTE — Telephone Encounter (Signed)
  Follow up Call-  Call back number 10/30/2013  Post procedure Call Back phone  # [phone]  Permission to leave phone message Yes     Patient questions:  Do you have a fever, pain , or abdominal swelling? No. Pain Score  0 *  Have you tolerated food without any problems? Yes.    Have you been able to return to your normal activities? Yes.    Do you have any questions about your discharge instructions: Diet   No. Medications  No. Follow up visit  No.  Do you have questions or concerns about your Care? Yes.    Actions: * If pain score is 4 or above: No action needed, pain <4.  Patient expressed concern that she may be allergic to her sedation. Patient stating she had a runny nose, cough and yellow sputum production all weekend. Patient denied fever. Patient stating she took"multiple doses" of Benadryl and is now completely fine.Discussed with patient that 02 is a very dry gas, which at times can cause dry, runny nose. Patient was suctioned during procedure and had complained of a sore throat post procedure. Patient informed to call PCP if problem reoccurs and develops fever. Patient satisfied with above, and note sent to Dr. Carlean Purl for any further suggestions.

## 2013-11-12 ENCOUNTER — Encounter: Payer: Self-pay | Admitting: Internal Medicine

## 2013-11-19 NOTE — Progress Notes (Signed)
Quick Note:  10/2013 - diminutive adenoma - repeat colon 2020 ______

## 2013-11-30 ENCOUNTER — Other Ambulatory Visit: Payer: Self-pay | Admitting: Family Medicine

## 2013-11-30 ENCOUNTER — Ambulatory Visit
Admission: RE | Admit: 2013-11-30 | Discharge: 2013-11-30 | Disposition: A | Discharge status: Home / Self Care | Payer: Medicare Other | Source: Ambulatory Visit | Attending: Family Medicine | Admitting: Family Medicine

## 2013-11-30 DIAGNOSIS — M47812 Spondylosis without myelopathy or radiculopathy, cervical region: Secondary | ICD-10-CM | POA: Diagnosis not present

## 2013-11-30 DIAGNOSIS — Z23 Encounter for immunization: Secondary | ICD-10-CM | POA: Diagnosis not present

## 2013-11-30 DIAGNOSIS — M542 Cervicalgia: Secondary | ICD-10-CM

## 2013-11-30 DIAGNOSIS — S139XXA Sprain of joints and ligaments of unspecified parts of neck, initial encounter: Secondary | ICD-10-CM | POA: Diagnosis not present

## 2013-11-30 DIAGNOSIS — M4310 Spondylolisthesis, site unspecified: Secondary | ICD-10-CM | POA: Diagnosis not present

## 2013-12-03 DIAGNOSIS — M542 Cervicalgia: Secondary | ICD-10-CM | POA: Diagnosis not present

## 2013-12-09 DIAGNOSIS — M542 Cervicalgia: Secondary | ICD-10-CM | POA: Diagnosis not present

## 2013-12-16 DIAGNOSIS — M542 Cervicalgia: Secondary | ICD-10-CM | POA: Diagnosis not present

## 2013-12-30 DIAGNOSIS — M542 Cervicalgia: Secondary | ICD-10-CM | POA: Diagnosis not present

## 2013-12-31 DIAGNOSIS — S0512XA Contusion of eyeball and orbital tissues, left eye, initial encounter: Secondary | ICD-10-CM | POA: Diagnosis not present

## 2014-03-02 DIAGNOSIS — M8580 Other specified disorders of bone density and structure, unspecified site: Secondary | ICD-10-CM | POA: Diagnosis not present

## 2014-04-01 DIAGNOSIS — Z1389 Encounter for screening for other disorder: Secondary | ICD-10-CM | POA: Diagnosis not present

## 2014-04-01 DIAGNOSIS — H9193 Unspecified hearing loss, bilateral: Secondary | ICD-10-CM | POA: Diagnosis not present

## 2014-04-01 DIAGNOSIS — M150 Primary generalized (osteo)arthritis: Secondary | ICD-10-CM | POA: Diagnosis not present

## 2014-04-01 DIAGNOSIS — Z800 Family history of malignant neoplasm of digestive organs: Secondary | ICD-10-CM | POA: Diagnosis not present

## 2014-04-01 DIAGNOSIS — Z0000 Encounter for general adult medical examination without abnormal findings: Secondary | ICD-10-CM | POA: Diagnosis not present

## 2014-04-01 DIAGNOSIS — E785 Hyperlipidemia, unspecified: Secondary | ICD-10-CM | POA: Diagnosis not present

## 2014-04-01 DIAGNOSIS — G43909 Migraine, unspecified, not intractable, without status migrainosus: Secondary | ICD-10-CM | POA: Diagnosis not present

## 2014-05-05 DIAGNOSIS — H524 Presbyopia: Secondary | ICD-10-CM | POA: Diagnosis not present

## 2014-05-05 DIAGNOSIS — H2513 Age-related nuclear cataract, bilateral: Secondary | ICD-10-CM | POA: Diagnosis not present

## 2014-06-16 DIAGNOSIS — M25572 Pain in left ankle and joints of left foot: Secondary | ICD-10-CM | POA: Diagnosis not present

## 2014-06-16 DIAGNOSIS — M6702 Short Achilles tendon (acquired), left ankle: Secondary | ICD-10-CM | POA: Diagnosis not present

## 2014-06-16 DIAGNOSIS — M2140 Flat foot [pes planus] (acquired), unspecified foot: Secondary | ICD-10-CM | POA: Diagnosis not present

## 2014-06-23 DIAGNOSIS — D225 Melanocytic nevi of trunk: Secondary | ICD-10-CM | POA: Diagnosis not present

## 2014-06-23 DIAGNOSIS — D2261 Melanocytic nevi of right upper limb, including shoulder: Secondary | ICD-10-CM | POA: Diagnosis not present

## 2014-06-23 DIAGNOSIS — D2271 Melanocytic nevi of right lower limb, including hip: Secondary | ICD-10-CM | POA: Diagnosis not present

## 2014-06-23 DIAGNOSIS — D2272 Melanocytic nevi of left lower limb, including hip: Secondary | ICD-10-CM | POA: Diagnosis not present

## 2014-06-23 DIAGNOSIS — D1801 Hemangioma of skin and subcutaneous tissue: Secondary | ICD-10-CM | POA: Diagnosis not present

## 2014-06-23 DIAGNOSIS — L821 Other seborrheic keratosis: Secondary | ICD-10-CM | POA: Diagnosis not present

## 2014-06-23 DIAGNOSIS — D2262 Melanocytic nevi of left upper limb, including shoulder: Secondary | ICD-10-CM | POA: Diagnosis not present

## 2014-06-23 DIAGNOSIS — L570 Actinic keratosis: Secondary | ICD-10-CM | POA: Diagnosis not present

## 2014-06-29 DIAGNOSIS — M76822 Posterior tibial tendinitis, left leg: Secondary | ICD-10-CM | POA: Diagnosis not present

## 2014-07-02 DIAGNOSIS — M76822 Posterior tibial tendinitis, left leg: Secondary | ICD-10-CM | POA: Diagnosis not present

## 2014-07-05 DIAGNOSIS — M76822 Posterior tibial tendinitis, left leg: Secondary | ICD-10-CM | POA: Diagnosis not present

## 2014-07-09 DIAGNOSIS — M76822 Posterior tibial tendinitis, left leg: Secondary | ICD-10-CM | POA: Diagnosis not present

## 2014-07-12 DIAGNOSIS — M76822 Posterior tibial tendinitis, left leg: Secondary | ICD-10-CM | POA: Diagnosis not present

## 2014-07-16 DIAGNOSIS — M76822 Posterior tibial tendinitis, left leg: Secondary | ICD-10-CM | POA: Diagnosis not present

## 2014-07-19 DIAGNOSIS — M76822 Posterior tibial tendinitis, left leg: Secondary | ICD-10-CM | POA: Diagnosis not present

## 2014-07-21 ENCOUNTER — Other Ambulatory Visit (HOSPITAL_COMMUNITY): Payer: Self-pay | Admitting: Family Medicine

## 2014-07-21 DIAGNOSIS — Z1231 Encounter for screening mammogram for malignant neoplasm of breast: Secondary | ICD-10-CM

## 2014-07-28 DIAGNOSIS — M25579 Pain in unspecified ankle and joints of unspecified foot: Secondary | ICD-10-CM | POA: Diagnosis not present

## 2014-07-28 DIAGNOSIS — M2142 Flat foot [pes planus] (acquired), left foot: Secondary | ICD-10-CM | POA: Diagnosis not present

## 2014-07-29 ENCOUNTER — Ambulatory Visit (HOSPITAL_COMMUNITY)
Admission: RE | Admit: 2014-07-29 | Discharge: 2014-07-29 | Disposition: A | Discharge status: Home / Self Care | Payer: Medicare Other | Source: Ambulatory Visit | Attending: Family Medicine | Admitting: Family Medicine

## 2014-07-29 ENCOUNTER — Other Ambulatory Visit (HOSPITAL_COMMUNITY): Payer: Self-pay | Admitting: Family Medicine

## 2014-07-29 DIAGNOSIS — Z1231 Encounter for screening mammogram for malignant neoplasm of breast: Secondary | ICD-10-CM

## 2014-08-09 DIAGNOSIS — M25572 Pain in left ankle and joints of left foot: Secondary | ICD-10-CM | POA: Diagnosis not present

## 2014-08-13 DIAGNOSIS — M2142 Flat foot [pes planus] (acquired), left foot: Secondary | ICD-10-CM | POA: Diagnosis not present

## 2014-08-13 DIAGNOSIS — M6702 Short Achilles tendon (acquired), left ankle: Secondary | ICD-10-CM | POA: Diagnosis not present

## 2014-08-16 ENCOUNTER — Other Ambulatory Visit: Payer: Self-pay | Admitting: Orthopedic Surgery

## 2014-08-16 DIAGNOSIS — M76822 Posterior tibial tendinitis, left leg: Secondary | ICD-10-CM | POA: Diagnosis not present

## 2014-09-02 DIAGNOSIS — M8588 Other specified disorders of bone density and structure, other site: Secondary | ICD-10-CM | POA: Diagnosis not present

## 2014-09-14 ENCOUNTER — Encounter (HOSPITAL_BASED_OUTPATIENT_CLINIC_OR_DEPARTMENT_OTHER): Payer: Self-pay | Admitting: *Deleted

## 2014-09-16 ENCOUNTER — Ambulatory Visit (HOSPITAL_BASED_OUTPATIENT_CLINIC_OR_DEPARTMENT_OTHER): Payer: Medicare Other | Admitting: Certified Registered"

## 2014-09-16 ENCOUNTER — Ambulatory Visit (HOSPITAL_BASED_OUTPATIENT_CLINIC_OR_DEPARTMENT_OTHER)
Admission: RE | Admit: 2014-09-16 | Discharge: 2014-09-17 | Disposition: A | Discharge status: Home / Self Care | Payer: Medicare Other | Source: Ambulatory Visit | Attending: Orthopedic Surgery | Admitting: Orthopedic Surgery

## 2014-09-16 ENCOUNTER — Encounter (HOSPITAL_BASED_OUTPATIENT_CLINIC_OR_DEPARTMENT_OTHER)
Admission: RE | Disposition: A | Discharge status: Home / Self Care | Payer: Self-pay | Source: Ambulatory Visit | Attending: Orthopedic Surgery

## 2014-09-16 ENCOUNTER — Encounter (HOSPITAL_BASED_OUTPATIENT_CLINIC_OR_DEPARTMENT_OTHER): Payer: Self-pay | Admitting: Certified Registered"

## 2014-09-16 DIAGNOSIS — E785 Hyperlipidemia, unspecified: Secondary | ICD-10-CM | POA: Insufficient documentation

## 2014-09-16 DIAGNOSIS — M2142 Flat foot [pes planus] (acquired), left foot: Secondary | ICD-10-CM | POA: Diagnosis not present

## 2014-09-16 DIAGNOSIS — G43909 Migraine, unspecified, not intractable, without status migrainosus: Secondary | ICD-10-CM | POA: Insufficient documentation

## 2014-09-16 DIAGNOSIS — Z79899 Other long term (current) drug therapy: Secondary | ICD-10-CM | POA: Diagnosis not present

## 2014-09-16 DIAGNOSIS — M8580 Other specified disorders of bone density and structure, unspecified site: Secondary | ICD-10-CM | POA: Insufficient documentation

## 2014-09-16 DIAGNOSIS — M6702 Short Achilles tendon (acquired), left ankle: Secondary | ICD-10-CM | POA: Diagnosis not present

## 2014-09-16 DIAGNOSIS — M1990 Unspecified osteoarthritis, unspecified site: Secondary | ICD-10-CM | POA: Diagnosis not present

## 2014-09-16 DIAGNOSIS — M76822 Posterior tibial tendinitis, left leg: Secondary | ICD-10-CM | POA: Insufficient documentation

## 2014-09-16 DIAGNOSIS — G8918 Other acute postprocedural pain: Secondary | ICD-10-CM | POA: Diagnosis not present

## 2014-09-16 DIAGNOSIS — M79662 Pain in left lower leg: Secondary | ICD-10-CM | POA: Diagnosis not present

## 2014-09-16 HISTORY — DX: Posterior tibial tendinitis, unspecified leg: M76.829

## 2014-09-16 HISTORY — DX: Anxiety disorder, unspecified: F41.9

## 2014-09-16 HISTORY — PX: CALCANEAL OSTEOTOMY: SHX1281

## 2014-09-16 HISTORY — PX: GASTROC RECESSION EXTREMITY: SHX6262

## 2014-09-16 LAB — POCT HEMOGLOBIN-HEMACUE: HEMOGLOBIN: 13.3 g/dL (ref 12.0–15.0)

## 2014-09-16 SURGERY — RECESSION, TENDON, GASTROCNEMIUS
Anesthesia: Regional | Site: Leg Lower | Laterality: Left

## 2014-09-16 MED ORDER — PROPOFOL 10 MG/ML IV BOLUS
INTRAVENOUS | Status: DC | PRN
  Administered 2014-09-16: 150 mg via INTRAVENOUS

## 2014-09-16 MED ORDER — BUPIVACAINE-EPINEPHRINE (PF) 0.5% -1:200000 IJ SOLN
INTRAMUSCULAR | Status: DC | PRN
  Administered 2014-09-16: 10 mL via PERINEURAL
  Administered 2014-09-16: 10 mL

## 2014-09-16 MED ORDER — ONDANSETRON HCL 4 MG PO TABS: 4.0000 mg | ORAL_TABLET | Freq: Four times a day (QID) | ORAL | Status: DC | PRN

## 2014-09-16 MED ORDER — PROMETHAZINE HCL 25 MG/ML IJ SOLN
6.2500 mg | Freq: Once | INTRAMUSCULAR | Status: AC
  Administered 2014-09-16: 6.25 mg via INTRAVENOUS

## 2014-09-16 MED ORDER — LIDOCAINE HCL (CARDIAC) 20 MG/ML IV SOLN
INTRAVENOUS | Status: DC | PRN
  Administered 2014-09-16: 30 mg via INTRAVENOUS

## 2014-09-16 MED ORDER — FENTANYL CITRATE (PF) 100 MCG/2ML IJ SOLN
50.0000 ug | INTRAMUSCULAR | Status: DC | PRN
  Administered 2014-09-16: 100 ug via INTRAVENOUS

## 2014-09-16 MED ORDER — SIMVASTATIN 20 MG PO TABS: 20.0000 mg | ORAL_TABLET | Freq: Every day | ORAL | Status: DC

## 2014-09-16 MED ORDER — SODIUM CHLORIDE 0.9 % IV SOLN: INTRAVENOUS | Status: DC

## 2014-09-16 MED ORDER — FENTANYL CITRATE (PF) 100 MCG/2ML IJ SOLN
INTRAMUSCULAR | Status: AC
  Filled 2014-09-16: qty 2

## 2014-09-16 MED ORDER — ONDANSETRON HCL 4 MG PO TABS: 4.0000 mg | ORAL_TABLET | Freq: Every day | ORAL | Status: AC | PRN

## 2014-09-16 MED ORDER — DEXAMETHASONE SODIUM PHOSPHATE 10 MG/ML IJ SOLN
INTRAMUSCULAR | Status: DC | PRN
  Administered 2014-09-16: 10 mg via INTRAVENOUS

## 2014-09-16 MED ORDER — LACTATED RINGERS IV SOLN
INTRAVENOUS | Status: DC
  Administered 2014-09-16 (×2): via INTRAVENOUS
  Administered 2014-09-16: 10 mL/h via INTRAVENOUS

## 2014-09-16 MED ORDER — OXYCODONE HCL 5 MG PO TABS
5.0000 mg | ORAL_TABLET | ORAL | Status: DC | PRN
  Administered 2014-09-17: 5 mg via ORAL
  Filled 2014-09-16: qty 1

## 2014-09-16 MED ORDER — MIDAZOLAM HCL 2 MG/2ML IJ SOLN
1.0000 mg | INTRAMUSCULAR | Status: DC | PRN
  Administered 2014-09-16: 2 mg via INTRAVENOUS

## 2014-09-16 MED ORDER — MIDAZOLAM HCL 2 MG/2ML IJ SOLN
INTRAMUSCULAR | Status: AC
Start: 2014-09-16 — End: 2014-09-16
  Filled 2014-09-16: qty 2

## 2014-09-16 MED ORDER — HYDROMORPHONE HCL 1 MG/ML IJ SOLN
INTRAMUSCULAR | Status: AC
  Filled 2014-09-16: qty 1

## 2014-09-16 MED ORDER — SODIUM CHLORIDE 0.9 % IV SOLN
INTRAVENOUS | Status: DC
  Administered 2014-09-16: 11:00:00 via INTRAVENOUS

## 2014-09-16 MED ORDER — VANCOMYCIN HCL IN DEXTROSE 1-5 GM/200ML-% IV SOLN
INTRAVENOUS | Status: AC
Start: 2014-09-16 — End: 2014-09-16
  Filled 2014-09-16: qty 200

## 2014-09-16 MED ORDER — GLYCOPYRROLATE 0.2 MG/ML IJ SOLN: 0.2000 mg | Freq: Once | INTRAMUSCULAR | Status: DC | PRN

## 2014-09-16 MED ORDER — SENNA 8.6 MG PO TABS
1.0000 | ORAL_TABLET | Freq: Two times a day (BID) | ORAL | Status: DC
  Administered 2014-09-17: 8.6 mg via ORAL
  Filled 2014-09-16: qty 1

## 2014-09-16 MED ORDER — VANCOMYCIN HCL IN DEXTROSE 1-5 GM/200ML-% IV SOLN
1000.0000 mg | INTRAVENOUS | Status: AC
  Administered 2014-09-16: 1000 mg via INTRAVENOUS

## 2014-09-16 MED ORDER — OXYCODONE HCL 5 MG PO TABS: 5.0000 mg | ORAL_TABLET | ORAL | Status: DC | PRN

## 2014-09-16 MED ORDER — FENTANYL CITRATE (PF) 100 MCG/2ML IJ SOLN
INTRAMUSCULAR | Status: AC
  Filled 2014-09-16: qty 6

## 2014-09-16 MED ORDER — ROPIVACAINE HCL 5 MG/ML IJ SOLN
INTRAMUSCULAR | Status: DC | PRN
  Administered 2014-09-16 (×2): 10 mL via PERINEURAL

## 2014-09-16 MED ORDER — PROPOFOL 500 MG/50ML IV EMUL
INTRAVENOUS | Status: AC
Start: 2014-09-16 — End: 2014-09-16
  Filled 2014-09-16: qty 50

## 2014-09-16 MED ORDER — ONDANSETRON HCL 4 MG/2ML IJ SOLN
INTRAMUSCULAR | Status: DC | PRN
  Administered 2014-09-16: 4 mg via INTRAVENOUS

## 2014-09-16 MED ORDER — SENNA 8.6 MG PO TABS: 2.0000 | ORAL_TABLET | Freq: Two times a day (BID) | ORAL | Status: DC

## 2014-09-16 MED ORDER — CHLORHEXIDINE GLUCONATE 4 % EX LIQD: 60.0000 mL | Freq: Once | CUTANEOUS | Status: DC

## 2014-09-16 MED ORDER — SCOPOLAMINE 1 MG/3DAYS TD PT72: 1.0000 | MEDICATED_PATCH | Freq: Once | TRANSDERMAL | Status: DC | PRN

## 2014-09-16 MED ORDER — SUMATRIPTAN SUCCINATE 50 MG PO TABS
50.0000 mg | ORAL_TABLET | ORAL | Status: DC | PRN
  Administered 2014-09-16 (×3): 25 mg via ORAL

## 2014-09-16 MED ORDER — PROMETHAZINE HCL 25 MG/ML IJ SOLN
INTRAMUSCULAR | Status: AC
  Filled 2014-09-16: qty 1

## 2014-09-16 MED ORDER — ONDANSETRON HCL 4 MG/2ML IJ SOLN: 4.0000 mg | Freq: Four times a day (QID) | INTRAMUSCULAR | Status: DC | PRN

## 2014-09-16 MED ORDER — ACETAMINOPHEN 650 MG RE SUPP: 650.0000 mg | Freq: Four times a day (QID) | RECTAL | Status: DC | PRN

## 2014-09-16 MED ORDER — ENOXAPARIN SODIUM 40 MG/0.4ML ~~LOC~~ SOLN
40.0000 mg | SUBCUTANEOUS | Status: DC
  Administered 2014-09-17: 40 mg via SUBCUTANEOUS

## 2014-09-16 MED ORDER — OXYCODONE HCL 5 MG/5ML PO SOLN
5.0000 mg | Freq: Once | ORAL | Status: DC | PRN
Start: 2014-09-16 — End: 2014-09-16

## 2014-09-16 MED ORDER — PROMETHAZINE HCL 25 MG/ML IJ SOLN
6.2500 mg | Freq: Four times a day (QID) | INTRAMUSCULAR | Status: DC | PRN
  Administered 2014-09-16: 6.25 mg via INTRAVENOUS

## 2014-09-16 MED ORDER — ACETAMINOPHEN 325 MG PO TABS
650.0000 mg | ORAL_TABLET | Freq: Four times a day (QID) | ORAL | Status: DC | PRN
Start: 2014-09-16 — End: 2014-09-17
  Administered 2014-09-16: 650 mg via ORAL
  Filled 2014-09-16: qty 2

## 2014-09-16 MED ORDER — OXYCODONE HCL 5 MG PO TABS: 5.0000 mg | ORAL_TABLET | Freq: Once | ORAL | Status: DC | PRN

## 2014-09-16 MED ORDER — SODIUM CHLORIDE 0.9 % IJ SOLN
INTRAMUSCULAR | Status: AC
Start: 2014-09-16 — End: 2014-09-16
  Filled 2014-09-16: qty 10

## 2014-09-16 MED ORDER — MEPERIDINE HCL 25 MG/ML IJ SOLN: 6.2500 mg | INTRAMUSCULAR | Status: DC | PRN

## 2014-09-16 MED ORDER — HYDROMORPHONE HCL 1 MG/ML IJ SOLN
0.2500 mg | INTRAMUSCULAR | Status: DC | PRN
  Administered 2014-09-16: 0.25 mg via INTRAVENOUS
  Administered 2014-09-16: 0.5 mg via INTRAVENOUS

## 2014-09-16 SURGICAL SUPPLY — 105 items
BAG DECANTER FOR FLEXI CONT (MISCELLANEOUS) ×4 IMPLANT
BANDAGE ESMARK 6X9 LF (GAUZE/BANDAGES/DRESSINGS) ×3 IMPLANT
BLADE ARTHRO LOK 4 BEAVER (BLADE) IMPLANT
BLADE AVERAGE 25X9 (BLADE) IMPLANT
BLADE CCA MICRO SAG (BLADE) IMPLANT
BLADE MICRO SAGITTAL (BLADE) ×4 IMPLANT
BLADE OSC/SAG .038X5.5 CUT EDG (BLADE) IMPLANT
BLADE SURG 11 STRL SS (BLADE) IMPLANT
BLADE SURG 15 STRL LF DISP TIS (BLADE) ×6 IMPLANT
BLADE SURG 15 STRL SS (BLADE) ×8
BNDG CMPR 9X6 STRL LF SNTH (GAUZE/BANDAGES/DRESSINGS) ×3
BNDG COHESIVE 4X5 TAN STRL (GAUZE/BANDAGES/DRESSINGS) ×4 IMPLANT
BNDG COHESIVE 6X5 TAN STRL LF (GAUZE/BANDAGES/DRESSINGS) ×4 IMPLANT
BNDG CONFORM 2 STRL LF (GAUZE/BANDAGES/DRESSINGS) IMPLANT
BNDG ESMARK 6X9 LF (GAUZE/BANDAGES/DRESSINGS) ×4
BUR EGG 3PK/BX (BURR) IMPLANT
CANISTER SUCT 1200ML W/VALVE (MISCELLANEOUS) ×4 IMPLANT
CHLORAPREP W/TINT 26ML (MISCELLANEOUS) ×4 IMPLANT
COVER BACK TABLE 60X90IN (DRAPES) ×4 IMPLANT
COVER MAYO STAND STRL (DRAPES) IMPLANT
CUFF TOURNIQUET SINGLE 34IN LL (TOURNIQUET CUFF) ×4 IMPLANT
DECANTER SPIKE VIAL GLASS SM (MISCELLANEOUS) IMPLANT
DRAPE C-ARM 42X72 X-RAY (DRAPES) IMPLANT
DRAPE C-ARMOR (DRAPES) IMPLANT
DRAPE EXTREMITY T 121X128X90 (DRAPE) ×4 IMPLANT
DRAPE LAPAROTOMY 100X72 PEDS (DRAPES) IMPLANT
DRAPE OEC MINIVIEW 54X84 (DRAPES) ×4 IMPLANT
DRAPE U 20/CS (DRAPES) IMPLANT
DRAPE U-SHAPE 47X51 STRL (DRAPES) ×4 IMPLANT
DRSG ADAPTIC 3X8 NADH LF (GAUZE/BANDAGES/DRESSINGS) ×2 IMPLANT
DRSG EMULSION OIL 3X3 NADH (GAUZE/BANDAGES/DRESSINGS) IMPLANT
DRSG MEPITEL 4X7.2 (GAUZE/BANDAGES/DRESSINGS) IMPLANT
DRSG PAD ABDOMINAL 8X10 ST (GAUZE/BANDAGES/DRESSINGS) ×8 IMPLANT
DRSG TEGADERM 2-3/8X2-3/4 SM (GAUZE/BANDAGES/DRESSINGS) IMPLANT
ELECT REM PT RETURN 9FT ADLT (ELECTROSURGICAL) ×4
ELECTRODE REM PT RTRN 9FT ADLT (ELECTROSURGICAL) ×3 IMPLANT
GAUZE SPONGE 4X4 12PLY STRL (GAUZE/BANDAGES/DRESSINGS) ×4 IMPLANT
GAUZE SPONGE 4X4 16PLY XRAY LF (GAUZE/BANDAGES/DRESSINGS) IMPLANT
GLOVE BIO SURGEON STRL SZ8 (GLOVE) ×4 IMPLANT
GLOVE BIOGEL PI IND STRL 7.0 (GLOVE) ×1 IMPLANT
GLOVE BIOGEL PI IND STRL 8 (GLOVE) ×6 IMPLANT
GLOVE BIOGEL PI INDICATOR 7.0 (GLOVE) ×1
GLOVE BIOGEL PI INDICATOR 8 (GLOVE) ×2
GLOVE ECLIPSE 6.5 STRL STRAW (GLOVE) ×2 IMPLANT
GLOVE ECLIPSE 7.5 STRL STRAW (GLOVE) ×4 IMPLANT
GLOVE EXAM NITRILE MD LF STRL (GLOVE) ×2 IMPLANT
GOWN STRL REUS W/ TWL LRG LVL3 (GOWN DISPOSABLE) ×3 IMPLANT
GOWN STRL REUS W/ TWL XL LVL3 (GOWN DISPOSABLE) ×6 IMPLANT
GOWN STRL REUS W/TWL LRG LVL3 (GOWN DISPOSABLE) ×4
GOWN STRL REUS W/TWL XL LVL3 (GOWN DISPOSABLE) ×8
GUIDEWIRE 1.1X6IN (WIRE) ×2 IMPLANT
KIT BIO-TENODESIS 3X8 DISP (MISCELLANEOUS)
KIT INSRT BABSR STRL DISP BTN (MISCELLANEOUS) IMPLANT
NDL SAFETY ECLIPSE 18X1.5 (NEEDLE) IMPLANT
NDL SUT 6 .5 CRC .975X.05 MAYO (NEEDLE) IMPLANT
NEEDLE HYPO 18GX1.5 SHARP (NEEDLE)
NEEDLE HYPO 22GX1.5 SAFETY (NEEDLE) IMPLANT
NEEDLE MAYO TAPER (NEEDLE)
NS IRRIG 1000ML POUR BTL (IV SOLUTION) ×4 IMPLANT
PACK BASIN DAY SURGERY FS (CUSTOM PROCEDURE TRAY) ×4 IMPLANT
PAD CAST 4YDX4 CTTN HI CHSV (CAST SUPPLIES) ×6 IMPLANT
PADDING CAST ABS 4INX4YD NS (CAST SUPPLIES)
PADDING CAST ABS COTTON 4X4 ST (CAST SUPPLIES) IMPLANT
PADDING CAST COTTON 4X4 STRL (CAST SUPPLIES) ×8
PADDING CAST COTTON 6X4 STRL (CAST SUPPLIES) ×4 IMPLANT
PASSER SUT SWANSON 36MM LOOP (INSTRUMENTS) IMPLANT
PENCIL BUTTON HOLSTER BLD 10FT (ELECTRODE) ×4 IMPLANT
PIN GUIDE DRILL TIP 2.8X300 (DRILL) ×4 IMPLANT
RETRIEVER SUT HEWSON (MISCELLANEOUS) IMPLANT
SANITIZER HAND PURELL 535ML FO (MISCELLANEOUS) ×4 IMPLANT
SCREW CANNULATED 6.5X50 (Screw) ×4 IMPLANT
SCREW INT RATTLER 4X15MM (Screw) ×2 IMPLANT
SHEET MEDIUM DRAPE 40X70 STRL (DRAPES) ×4 IMPLANT
SLEEVE SCD COMPRESS KNEE MED (MISCELLANEOUS) ×4 IMPLANT
SPLINT FAST PLASTER 5X30 (CAST SUPPLIES) ×20
SPLINT PLASTER CAST FAST 5X30 (CAST SUPPLIES) ×60 IMPLANT
SPONGE LAP 18X18 X RAY DECT (DISPOSABLE) ×4 IMPLANT
STAPLER VISISTAT 35W (STAPLE) IMPLANT
STOCKINETTE 6  STRL (DRAPES) ×1
STOCKINETTE 6 STRL (DRAPES) ×3 IMPLANT
STRIP CLOSURE SKIN 1/2X4 (GAUZE/BANDAGES/DRESSINGS) IMPLANT
SUCTION FRAZIER TIP 10 FR DISP (SUCTIONS) ×4 IMPLANT
SUT 2 FIBERLOOP 20 STRT BLUE (SUTURE)
SUT BONE WAX W31G (SUTURE) IMPLANT
SUT ETHIBOND 2 OS 4 DA (SUTURE) IMPLANT
SUT ETHILON 3 0 PS 1 (SUTURE) ×6 IMPLANT
SUT FIBERWIRE #2 38 T-5 BLUE (SUTURE)
SUT FIBERWIRE 2-0 18 17.9 3/8 (SUTURE)
SUT MERSILENE 2.0 SH NDLE (SUTURE) IMPLANT
SUT MNCRL AB 3-0 PS2 18 (SUTURE) ×6 IMPLANT
SUT VIC AB 0 SH 27 (SUTURE) ×6 IMPLANT
SUT VIC AB 2-0 PS2 27 (SUTURE) IMPLANT
SUT VIC AB 2-0 SH 27 (SUTURE)
SUT VIC AB 2-0 SH 27XBRD (SUTURE) IMPLANT
SUT VIC AB 3-0 PS1 18 (SUTURE)
SUT VIC AB 3-0 PS1 18XBRD (SUTURE) IMPLANT
SUTURE 2 FIBERLOOP 20 STRT BLU (SUTURE) IMPLANT
SUTURE FIBERWR #2 38 T-5 BLUE (SUTURE) IMPLANT
SUTURE FIBERWR 2-0 18 17.9 3/8 (SUTURE) IMPLANT
SYR BULB 3OZ (MISCELLANEOUS) ×4 IMPLANT
SYR CONTROL 10ML LL (SYRINGE) IMPLANT
TOWEL OR 17X24 6PK STRL BLUE (TOWEL DISPOSABLE) ×6 IMPLANT
TUBE CONNECTING 20X1/4 (TUBING) ×4 IMPLANT
UNDERPAD 30X30 (UNDERPADS AND DIAPERS) ×4 IMPLANT
YANKAUER SUCT BULB TIP NO VENT (SUCTIONS) IMPLANT

## 2014-09-16 NOTE — Anesthesia Postprocedure Evaluation (Signed)
  Anesthesia Post-op Note  Patient: Courtney Lawrence  Procedure(s) Performed: Procedure(s): LEFT GASTROCNEMIUS RECESSION  (Left) LEFT FLEXOR DIGITORUM LONGUS TENDON TO TRANSFER NAVICULAR (Left) CALCANEAL OSTEOTOMY (Left)  Patient Location: PACU  Anesthesia Type: General, Regional   Level of Consciousness: awake, alert  and oriented  Airway and Oxygen Therapy: Patient Spontanous Breathing  Post-op Pain: mild  Post-op Assessment: Post-op Vital signs reviewed  Post-op Vital Signs: Reviewed  Last Vitals:  Filed Vitals:   09/16/14 1035  BP: 144/80  Pulse: 77  Temp: 36.2 C  Resp: 14    Complications: No apparent anesthesia complications

## 2014-09-16 NOTE — H&P (Signed)
Courtney Lawrence is an 74 y.o. female.   Chief Complaint: left foot pain HPI: 74 y/o female with left posterior tibial tendon dysfunction, tight heelcord and flatfoot.  She has failed non op treatment and presents now for surgical correction.    Past Medical History  Diagnosis Date  . Seasonal allergies   . Migraine   . DJD (degenerative joint disease)   . Urticaria, chronic   . Osteopenia   . Diverticulosis 10/28/2008  . Hyperlipidemia   . Personal history of colonic polyp - adenoma 10/30/2013  . Anxiety   . Posterior tibial tendonitis     left    Past Surgical History  Procedure Laterality Date  . Foot surgery Right 2012 ALSO 2003  . Total knee arthroplasty  05/14/2011    Procedure: TOTAL KNEE ARTHROPLASTY;  Surgeon: Mauri Pole, MD;  Location: [facility] ORS;  Service: Orthopedics;  Laterality: Left;  . Colonoscopy  10/28/2008    Dr. Franki Cabot  . Joint replacement Left     Family History  Problem Relation Age of Onset  . Colon cancer Father 83  . Heart attack Father   . Coronary artery disease Father   . Rheum arthritis Mother   . Diabetes Paternal Grandfather   . Breast cancer Maternal Grandfather   . Colon cancer Sister 76   Social History:  reports that she has never smoked. She has never used smokeless tobacco. She reports that she drinks about 0.5 oz of alcohol per week. She reports that she does not use illicit drugs.  Allergies:  Allergies  Allergen Reactions  . Aspirin Hives and Other (See Comments)    Headache   . Codeine Nausea And Vomiting  . Penicillins     Per pt: family hx allergy to PCN  . Platinum-Containing Compounds     swelling  . Yellow Dyes (Non-Tartrazine) Hives  . Nickel Rash    GETS "INFECTION" WITH NICKEL EARRINGS    Medications Prior to Admission  Medication Sig Dispense Refill  . CALCIUM-MAGNESIUM-VITAMIN D PO Take 1 tablet by mouth 2 (two) times daily.    . celecoxib (CELEBREX) 100 MG capsule Take 100 mg by mouth daily.    .  Cholecalciferol (VITAMIN D-3) 5000 UNITS TABS Take by mouth. 2 every week    . glucosamine-chondroitin 500-400 MG tablet Take 1 tablet by mouth 2 (two) times daily.    . Multiple Vitamins-Minerals (ICAPS PO) Take 1 tablet by mouth daily.    . simvastatin (ZOCOR) 20 MG tablet Take 20 mg by mouth daily.    . SUMAtriptan (IMITREX) 100 MG tablet Take 50 mg by mouth every 2 (two) hours as needed. Headache      No results found for this or any previous visit (from the past 48 hour(s)). No results found.  ROS  No recent f/c/n/v/wt loss  Blood pressure 125/77, pulse 72, temperature 98.3 F (36.8 C), temperature source Oral, resp. rate 11, height 5\' 4"  (1.626 m), weight 79.379 kg (175 lb), SpO2 100 %. Physical Exam  wn wd woman in nad.  A and O x 4.  Mood and affect normal.  EOMI.  resp unlabored.  L foot with flatfoot deformity.  Heelcord is tight.  Skin intact and healthy.  No lymphadenopathy.  4/5 strength in inversion.  5/5 in eversion, DF and PF.    Assessment/Plan L flatfoot deformity, tight heelcord and post tib tendonitis - to OR for calc osteotomy, gastroc recesion, FDL transfer and possible lateral column lengthening.  The risks and benefits of the alternative treatment options have been discussed in detail.  The patient wishes to proceed with surgery and specifically understands risks of bleeding, infection, nerve damage, blood clots, need for additional surgery, amputation and death.   Wylene Simmer 2014/09/24, 7:21 AM

## 2014-09-16 NOTE — Anesthesia Preprocedure Evaluation (Addendum)
Anesthesia Evaluation  Patient identified by MRN, date of birth, ID band Patient awake    Reviewed: Allergy & Precautions, NPO status   Airway Mallampati: I  TM Distance: >3 FB Neck ROM: Full    Dental  (+) Teeth Intact, Dental Advisory Given   Pulmonary  breath sounds clear to auscultation        Cardiovascular Rhythm:Regular Rate:Normal     Neuro/Psych  Headaches,    GI/Hepatic   Endo/Other    Renal/GU      Musculoskeletal  (+) Arthritis -,   Abdominal   Peds  Hematology   Anesthesia Other Findings   Reproductive/Obstetrics                            Anesthesia Physical Anesthesia Plan  ASA: II  Anesthesia Plan: General and Regional   Post-op Pain Management:    Induction: Intravenous  Airway Management Planned: LMA  Additional Equipment:   Intra-op Plan:   Post-operative Plan: Extubation in OR  Informed Consent: I have reviewed the patients History and Physical, chart, labs and discussed the procedure including the risks, benefits and alternatives for the proposed anesthesia with the patient or authorized representative who has indicated his/her understanding and acceptance.   Dental advisory given  Plan Discussed with: CRNA, Surgeon and Anesthesiologist  Anesthesia Plan Comments:         Anesthesia Quick Evaluation

## 2014-09-16 NOTE — Anesthesia Procedure Notes (Addendum)
Procedure Name: LMA Insertion Date/Time: 09/16/2014 7:37 AM Performed by: BLOCKER, TIMOTHY D Pre-anesthesia Checklist: Patient identified, Emergency Drugs available, Suction available and Patient being monitored Patient Re-evaluated:Patient Re-evaluated prior to inductionOxygen Delivery Method: Circle System Utilized Preoxygenation: Pre-oxygenation with 100% oxygen Intubation Type: IV induction Ventilation: Mask ventilation without difficulty LMA: LMA inserted LMA Size: 4.0 Number of attempts: 1 Airway Equipment and Method: Bite block Placement Confirmation: positive ETCO2 Tube secured with: Tape Dental Injury: Teeth and Oropharynx as per pre-operative assessment     Anesthesia Regional Block:  Popliteal block  Pre-Anesthetic Checklist: ,, timeout performed, Correct Patient, Correct Site, Correct Laterality, Correct Procedure, Correct Position, site marked, Risks and benefits discussed,  Surgical consent,  Pre-op evaluation,  At surgeon's request and post-op pain management  Laterality: Left and Lower  Prep: chloraprep       Needles:  Injection technique: Single-shot  Needle Type: Echogenic Needle     Needle Length: 9cm 9 cm Needle Gauge: 21 and 21 G    Additional Needles:  Procedures: ultrasound guided (picture in chart) Popliteal block Narrative:  Start time: 09/16/2014 7:06 AM End time: 09/16/2014 7:10 AM Injection made incrementally with aspirations every 5 mL.  Performed by: Personally  Anesthesiologist: Sidney Kann   Anesthesia Regional Block:  Adductor canal block  Pre-Anesthetic Checklist: ,, timeout performed, Correct Patient, Correct Site, Correct Laterality, Correct Procedure, Correct Position, site marked, Risks and benefits discussed,  Surgical consent,  Pre-op evaluation,  At surgeon's request and post-op pain management  Laterality: Left and Lower  Prep: chloraprep       Needles:  Injection technique: Single-shot  Needle Type: Echogenic Needle      Needle Length: 9cm 9 cm Needle Gauge: 21 and 21 G    Additional Needles:  Procedures: ultrasound guided (picture in chart) Adductor canal block Narrative:  Start time: 09/16/2014 7:11 AM End time: 09/16/2014 7:14 AM Injection made incrementally with aspirations every 5 mL.  Performed by: Personally  Anesthesiologist: Dashaun Onstott

## 2014-09-16 NOTE — Progress Notes (Signed)
Assisted Dr. Crews with left, ultrasound guided, popliteal/saphenous block. Side rails up, monitors on throughout procedure. See vital signs in flow sheet. Tolerated Procedure well. 

## 2014-09-16 NOTE — Brief Op Note (Signed)
09/16/2014  9:13 AM  PATIENT:  Courtney Lawrence  74 y.o. female  PRE-OPERATIVE DIAGNOSIS: 1.  Left posterior tibial tendonitis      2.  Left tight heelcord      3.  Left adult flatfoot deformity  POST-OPERATIVE DIAGNOSIS:  same  Procedure(s): 1.  LEFT GASTROCNEMIUS RECESSION (separate incision) 2.  LEFT FLEXOR DIGITORUM LONGUS TENDON TRANSFER TO NAVICULAR 3.  Left posterior tibial tendon tenolysis 4.  Left calcaneal osteotomy (separate incision) 5.  Left foot 3 view xrays  SURGEON:  Wylene Simmer, MD  ASSISTANT: [name], PA-C  ANESTHESIA:   General, regional  EBL:  minimal   TOURNIQUET:   Total Tourniquet Time Documented: Thigh (Left) - 65 minutes Total: Thigh (Left) - 65 minutes  COMPLICATIONS:  None apparent  DISPOSITION:  Extubated, awake and stable to recovery.  DICTATION ID:  [id number]

## 2014-09-16 NOTE — Transfer of Care (Signed)
Immediate Anesthesia Transfer of Care Note  Patient: Courtney Lawrence  Procedure(s) Performed: Procedure(s): LEFT GASTROCNEMIUS RECESSION  (Left) LEFT FLEXOR DIGITORUM LONGUS TENDON TO TRANSFER NAVICULAR (Left) CALCANEAL OSTEOTOMY (Left)  Patient Location: PACU  Anesthesia Type:GA combined with regional for post-op pain  Level of Consciousness: awake, alert , oriented and patient cooperative  Airway & Oxygen Therapy: Patient Spontanous Breathing and Patient connected to face mask oxygen  Post-op Assessment: Report given to RN and Post -op Vital signs reviewed and stable  Post vital signs: Reviewed and stable  Last Vitals:  Filed Vitals:   09/16/14 0901  BP:   Pulse: 99  Temp:   Resp: 15    Complications: No apparent anesthesia complications

## 2014-09-16 NOTE — Discharge Instructions (Signed)
Courtney Simmer, MD [place]  Please read the following information regarding your care after surgery.  Medications  You only need a prescription for the narcotic pain medicine (ex. oxycodone, Percocet, Norco).  All of the other medicines listed below are available over the counter. X acetominophen (Tylenol) 650 mg every 4-6 hours as you need for minor pain X oxycodone as prescribed for moderate to severe pain X zofran as needed for nausea  Narcotic pain medicine (ex. oxycodone, Percocet, Vicodin) will cause constipation.  To prevent this problem, take the following medicines while you are taking any pain medicine. X senna (Senokot) 2 tablets twice a day    Weight Bearing X Do not bear any weight on the operated leg or foot.  Cast / Splint / Dressing X Keep your splint or cast clean and dry.  Dont put anything (coat hanger, pencil, etc) down inside of it.  If it gets damp, use a hair dryer on the cool setting to dry it.  If it gets soaked, call the office to schedule an appointment for a cast change.   After your dressing, cast or splint is removed; you may shower, but do not soak or scrub the wound.  Allow the water to run over it, and then gently pat it dry.  Swelling It is normal for you to have swelling where you had surgery.  To reduce swelling and pain, keep your toes above your nose for at least 3 days after surgery.  It may be necessary to keep your foot or leg elevated for several weeks.  If it hurts, it should be elevated.  Follow Up Call my office at [phone] when you are discharged from the hospital or surgery center to schedule an appointment to be seen two weeks after surgery.  Call my office at [phone] if you develop a fever >101.5 F, nausea, vomiting, bleeding from the surgical site or severe pain.

## 2014-09-17 ENCOUNTER — Encounter (HOSPITAL_BASED_OUTPATIENT_CLINIC_OR_DEPARTMENT_OTHER): Payer: Self-pay | Admitting: Orthopedic Surgery

## 2014-09-17 DIAGNOSIS — M8580 Other specified disorders of bone density and structure, unspecified site: Secondary | ICD-10-CM | POA: Diagnosis not present

## 2014-09-17 DIAGNOSIS — M2142 Flat foot [pes planus] (acquired), left foot: Secondary | ICD-10-CM | POA: Diagnosis not present

## 2014-09-17 DIAGNOSIS — M1990 Unspecified osteoarthritis, unspecified site: Secondary | ICD-10-CM | POA: Diagnosis not present

## 2014-09-17 DIAGNOSIS — M76822 Posterior tibial tendinitis, left leg: Secondary | ICD-10-CM | POA: Diagnosis not present

## 2014-09-17 DIAGNOSIS — M6702 Short Achilles tendon (acquired), left ankle: Secondary | ICD-10-CM | POA: Diagnosis not present

## 2014-09-17 DIAGNOSIS — E785 Hyperlipidemia, unspecified: Secondary | ICD-10-CM | POA: Diagnosis not present

## 2014-09-17 NOTE — Op Note (Signed)
Courtney Lawrence, Courtney Lawrence                ACCOUNT NO.:  [id number]  MEDICAL RECORD NO.:  [id number]  LOCATION:                               FACILITY:  [facility]  PHYSICIAN:  Wylene Simmer, MD        DATE OF BIRTH:  1940/12/28  DATE OF PROCEDURE:  09/16/2014 DATE OF DISCHARGE:                              OPERATIVE REPORT   PREOPERATIVE DIAGNOSES: 1. Left posterior tibial tendon dysfunction. 2. Left tight heel cord. 3. Left adult flatfoot deformity.  POSTOPERATIVE DIAGNOSES: 1. Left posterior tibial tendon dysfunction. 2. Left tight heel cord. 3. Left adult flatfoot deformity.  PROCEDURES: 1. Left gastrocnemius recession through a separate incision. 2. Left flexor digitorum longus tendon transfer to the navicular. 3. Left posterior tibial tendon tenolysis. 4. Left calcaneal medializing osteotomy through a separate incision. 5. Left foot AP, lateral and Harris heel radiographs.  SURGEON:  Wylene Simmer, MD.  ASSISTANTLarkin Ina P. Ollis, PA-C.  ANESTHESIA:  General, regional.  ESTIMATED BLOOD LOSS:  Minimal.  TOURNIQUET TIME:  65 minutes at 250 mmHg.  COMPLICATIONS:  None apparent.  DISPOSITION:  Extubated, awake and stable to recovery.  INDICATIONS FOR PROCEDURE:  The patient is a 74 year old woman who has symptoms and signs consistent of adult flatfoot deformity.  She has failed nonoperative treatment to date including activity modification, bracing physical therapy, oral anti-inflammatories and shoe wear modifications.  She presents now for operative treatment of this painful condition.  She understands the risks and benefits, the alternative treatment options and elects surgical treatment.  She specifically understands risks of bleeding, infection, nerve damage, blood clots, need for additional surgery, continued pain, recurrence of her deformity, amputation, and death.  PROCEDURE IN DETAIL:  After preoperative consent was obtained and the correct operative site was  identified, the patient was brought to the operating room and placed supine on the operating table.  General anesthesia was induced.  Preoperative antibiotics were administered. Surgical time-out was taken.  Left lower extremity was prepped and draped in standard sterile fashion with tourniquet around the thigh. Extremity was exsanguinated and the tourniquet was inflated to 250 mmHg. A longitudinal incision was then made over the medial calf.  Sharp dissection was carried down through the skin and subcutaneous tissue. The superficial fascia was incised.  The gastrocnemius tendon was identified.  It was divided from medial to lateral under direct vision taking care to protect the sural nerve.  The ankle would then dorsiflex approximately 30 degrees with the knee extended.  The wound was irrigated and closed with Monocryl and nylon.  Attention was then turned to the lateral aspect of the hindfoot where an oblique incision was made over the lateral wall of the calcaneus.  The periosteum was incised.  An osteotomy was then made with an oscillating saw.  The osteotomy site was mobilized and the calcaneus was translated medially approximately 6 mm.  Two 6.5-mm cannulated partially-threaded screws were inserted through an incision in the heel.  Both were noted to have excellent purchase.  Harris heel and lateral radiographs confirmed appropriate positioning of the osteotomy and appropriate position and length of the screws.  Wound was irrigated and closed with nylon.  Attention was then turned to the medial aspect of the ankle where a curvilinear incision was made over the posterior tibial tendon sheath extending down to the level of the navicular.  Sharp dissection was carried down through the skin and subcutaneous tissue, and superficial tendon sheath.  The posterior tibial tendon was then carefully examined. It was noted to be intact and tenolysis was performed removing all diseased tissue  and tenosynovitis with scissors.  The FDL tendon sheath was incised.  The FDL was traced down into the level of the midfoot and transected at the knot of Mallie Mussel.  Whipstitch was inserted.  A 4.7-mm hole was drilled in the navicular and the tendon was pulled up from plantar to dorsal using suture passer.  The tendon was then fixed to the navicular using a 4-mm Biomet Rattler interference screw.  The whipstitch was then repaired to the periosteum of the navicular with horizontal mattress suture.  The wound was irrigated copiously.  The posterior tibial tendon and FDL tendon were repaired side-to-side with horizontal mattress sutures of 0 Vicryl.  The tendon sheath was repaired with simple sutures of 0 Vicryl.  Skin was closed with Monocryl and nylon.  Sterile dressings were applied followed by well-padded short-leg splint.  Tourniquet was released at 1 hour and 3 minutes.  After application of the dressings, the patient was awakened from anesthesia and transported to the recovery room in stable condition.  FOLLOWUP PLAN:  The patient will be nonweightbearing on the left lower extremity.  She will be observed overnight for pain control.  She will follow up with me in a couple of weeks for suture removal and conversion to a cast.  [name], PA-C was present and scrubbed for the duration of the case.  His assistance was essential in positioning the patient, prepping and draping, gaining and maintaining exposure, performing the operation, closing and dressing the wounds, and applying the splint.  RADIOGRAPHS:  Harris heel, lateral and AP radiographs of the foot were obtained intraoperatively.  These show interval correction of the flatfoot deformity.  Hardware at the calcaneus and navicular is appropriately positioned and at the appropriate length.  No other acute injuries are noted.     Wylene Simmer, MD   ______________________________ Wylene Simmer, MD    JH/MEDQ  D:   09/16/2014  T:  09/16/2014  Job:  [id number]

## 2014-09-27 ENCOUNTER — Encounter (HOSPITAL_BASED_OUTPATIENT_CLINIC_OR_DEPARTMENT_OTHER): Payer: Self-pay | Admitting: Orthopedic Surgery

## 2014-09-30 DIAGNOSIS — M6702 Short Achilles tendon (acquired), left ankle: Secondary | ICD-10-CM | POA: Diagnosis not present

## 2014-09-30 DIAGNOSIS — M2142 Flat foot [pes planus] (acquired), left foot: Secondary | ICD-10-CM | POA: Diagnosis not present

## 2014-09-30 DIAGNOSIS — Z4789 Encounter for other orthopedic aftercare: Secondary | ICD-10-CM | POA: Diagnosis not present

## 2014-10-29 DIAGNOSIS — M6702 Short Achilles tendon (acquired), left ankle: Secondary | ICD-10-CM | POA: Diagnosis not present

## 2014-10-29 DIAGNOSIS — Z4789 Encounter for other orthopedic aftercare: Secondary | ICD-10-CM | POA: Diagnosis not present

## 2014-11-01 DIAGNOSIS — M76822 Posterior tibial tendinitis, left leg: Secondary | ICD-10-CM | POA: Diagnosis not present

## 2014-11-04 DIAGNOSIS — M76822 Posterior tibial tendinitis, left leg: Secondary | ICD-10-CM | POA: Diagnosis not present

## 2014-11-10 DIAGNOSIS — M76822 Posterior tibial tendinitis, left leg: Secondary | ICD-10-CM | POA: Diagnosis not present

## 2014-11-12 DIAGNOSIS — M76822 Posterior tibial tendinitis, left leg: Secondary | ICD-10-CM | POA: Diagnosis not present

## 2014-11-15 DIAGNOSIS — M76822 Posterior tibial tendinitis, left leg: Secondary | ICD-10-CM | POA: Diagnosis not present

## 2014-11-19 DIAGNOSIS — M76822 Posterior tibial tendinitis, left leg: Secondary | ICD-10-CM | POA: Diagnosis not present

## 2014-11-23 DIAGNOSIS — M76822 Posterior tibial tendinitis, left leg: Secondary | ICD-10-CM | POA: Diagnosis not present

## 2014-11-25 DIAGNOSIS — M76822 Posterior tibial tendinitis, left leg: Secondary | ICD-10-CM | POA: Diagnosis not present

## 2014-11-26 DIAGNOSIS — Z4789 Encounter for other orthopedic aftercare: Secondary | ICD-10-CM | POA: Diagnosis not present

## 2014-11-30 DIAGNOSIS — M76822 Posterior tibial tendinitis, left leg: Secondary | ICD-10-CM | POA: Diagnosis not present

## 2014-12-27 DIAGNOSIS — Z4789 Encounter for other orthopedic aftercare: Secondary | ICD-10-CM | POA: Diagnosis not present

## 2014-12-30 DIAGNOSIS — Z23 Encounter for immunization: Secondary | ICD-10-CM | POA: Diagnosis not present

## 2015-03-21 DIAGNOSIS — M8580 Other specified disorders of bone density and structure, unspecified site: Secondary | ICD-10-CM | POA: Diagnosis not present

## 2015-04-04 DIAGNOSIS — H9193 Unspecified hearing loss, bilateral: Secondary | ICD-10-CM | POA: Diagnosis not present

## 2015-04-04 DIAGNOSIS — Z800 Family history of malignant neoplasm of digestive organs: Secondary | ICD-10-CM | POA: Diagnosis not present

## 2015-04-04 DIAGNOSIS — Z0000 Encounter for general adult medical examination without abnormal findings: Secondary | ICD-10-CM | POA: Diagnosis not present

## 2015-04-04 DIAGNOSIS — M8580 Other specified disorders of bone density and structure, unspecified site: Secondary | ICD-10-CM | POA: Diagnosis not present

## 2015-04-04 DIAGNOSIS — Z1389 Encounter for screening for other disorder: Secondary | ICD-10-CM | POA: Diagnosis not present

## 2015-04-04 DIAGNOSIS — M150 Primary generalized (osteo)arthritis: Secondary | ICD-10-CM | POA: Diagnosis not present

## 2015-04-04 DIAGNOSIS — G43909 Migraine, unspecified, not intractable, without status migrainosus: Secondary | ICD-10-CM | POA: Diagnosis not present

## 2015-04-04 DIAGNOSIS — E785 Hyperlipidemia, unspecified: Secondary | ICD-10-CM | POA: Diagnosis not present

## 2015-04-04 DIAGNOSIS — F418 Other specified anxiety disorders: Secondary | ICD-10-CM | POA: Diagnosis not present

## 2015-04-19 DIAGNOSIS — H18411 Arcus senilis, right eye: Secondary | ICD-10-CM | POA: Diagnosis not present

## 2015-04-19 DIAGNOSIS — H2512 Age-related nuclear cataract, left eye: Secondary | ICD-10-CM | POA: Diagnosis not present

## 2015-04-19 DIAGNOSIS — H18412 Arcus senilis, left eye: Secondary | ICD-10-CM | POA: Diagnosis not present

## 2015-04-19 DIAGNOSIS — H2511 Age-related nuclear cataract, right eye: Secondary | ICD-10-CM | POA: Diagnosis not present

## 2015-04-25 DIAGNOSIS — F418 Other specified anxiety disorders: Secondary | ICD-10-CM | POA: Diagnosis not present

## 2015-04-25 DIAGNOSIS — M859 Disorder of bone density and structure, unspecified: Secondary | ICD-10-CM | POA: Diagnosis not present

## 2015-04-25 DIAGNOSIS — M150 Primary generalized (osteo)arthritis: Secondary | ICD-10-CM | POA: Diagnosis not present

## 2015-04-25 DIAGNOSIS — M8589 Other specified disorders of bone density and structure, multiple sites: Secondary | ICD-10-CM | POA: Diagnosis not present

## 2015-08-02 ENCOUNTER — Other Ambulatory Visit: Payer: Self-pay

## 2015-08-02 DIAGNOSIS — Z1231 Encounter for screening mammogram for malignant neoplasm of breast: Secondary | ICD-10-CM

## 2015-08-12 ENCOUNTER — Ambulatory Visit
Admission: RE | Admit: 2015-08-12 | Discharge: 2015-08-12 | Disposition: A | Discharge status: Home / Self Care | Payer: Medicare Other | Source: Ambulatory Visit

## 2015-08-12 DIAGNOSIS — Z1231 Encounter for screening mammogram for malignant neoplasm of breast: Secondary | ICD-10-CM

## 2015-09-07 DIAGNOSIS — L308 Other specified dermatitis: Secondary | ICD-10-CM | POA: Diagnosis not present

## 2015-09-07 DIAGNOSIS — D2271 Melanocytic nevi of right lower limb, including hip: Secondary | ICD-10-CM | POA: Diagnosis not present

## 2015-09-07 DIAGNOSIS — L814 Other melanin hyperpigmentation: Secondary | ICD-10-CM | POA: Diagnosis not present

## 2015-09-07 DIAGNOSIS — D1801 Hemangioma of skin and subcutaneous tissue: Secondary | ICD-10-CM | POA: Diagnosis not present

## 2015-09-07 DIAGNOSIS — D1724 Benign lipomatous neoplasm of skin and subcutaneous tissue of left leg: Secondary | ICD-10-CM | POA: Diagnosis not present

## 2015-09-07 DIAGNOSIS — L821 Other seborrheic keratosis: Secondary | ICD-10-CM | POA: Diagnosis not present

## 2015-09-28 DIAGNOSIS — Z0100 Encounter for examination of eyes and vision without abnormal findings: Secondary | ICD-10-CM | POA: Diagnosis not present

## 2015-09-28 DIAGNOSIS — H2513 Age-related nuclear cataract, bilateral: Secondary | ICD-10-CM | POA: Diagnosis not present

## 2015-09-28 DIAGNOSIS — H25013 Cortical age-related cataract, bilateral: Secondary | ICD-10-CM | POA: Diagnosis not present

## 2015-10-11 DIAGNOSIS — H52201 Unspecified astigmatism, right eye: Secondary | ICD-10-CM | POA: Diagnosis not present

## 2015-10-11 DIAGNOSIS — H25811 Combined forms of age-related cataract, right eye: Secondary | ICD-10-CM | POA: Diagnosis not present

## 2015-10-11 DIAGNOSIS — H2511 Age-related nuclear cataract, right eye: Secondary | ICD-10-CM | POA: Diagnosis not present

## 2015-10-11 DIAGNOSIS — H25011 Cortical age-related cataract, right eye: Secondary | ICD-10-CM | POA: Diagnosis not present

## 2015-10-25 DIAGNOSIS — H25812 Combined forms of age-related cataract, left eye: Secondary | ICD-10-CM | POA: Diagnosis not present

## 2015-10-25 DIAGNOSIS — H2512 Age-related nuclear cataract, left eye: Secondary | ICD-10-CM | POA: Diagnosis not present

## 2015-10-25 DIAGNOSIS — H25012 Cortical age-related cataract, left eye: Secondary | ICD-10-CM | POA: Diagnosis not present

## 2015-10-25 DIAGNOSIS — H52202 Unspecified astigmatism, left eye: Secondary | ICD-10-CM | POA: Diagnosis not present

## 2015-11-09 DIAGNOSIS — E785 Hyperlipidemia, unspecified: Secondary | ICD-10-CM | POA: Diagnosis not present

## 2015-11-09 DIAGNOSIS — F418 Other specified anxiety disorders: Secondary | ICD-10-CM | POA: Diagnosis not present

## 2015-11-09 DIAGNOSIS — Z23 Encounter for immunization: Secondary | ICD-10-CM | POA: Diagnosis not present

## 2015-11-09 DIAGNOSIS — M8580 Other specified disorders of bone density and structure, unspecified site: Secondary | ICD-10-CM | POA: Diagnosis not present

## 2015-12-12 DIAGNOSIS — K529 Noninfective gastroenteritis and colitis, unspecified: Secondary | ICD-10-CM | POA: Diagnosis not present

## 2015-12-16 DIAGNOSIS — R197 Diarrhea, unspecified: Secondary | ICD-10-CM | POA: Diagnosis not present

## 2016-04-03 DIAGNOSIS — Z111 Encounter for screening for respiratory tuberculosis: Secondary | ICD-10-CM | POA: Diagnosis not present

## 2016-04-17 DIAGNOSIS — Z111 Encounter for screening for respiratory tuberculosis: Secondary | ICD-10-CM | POA: Diagnosis not present

## 2016-05-11 DIAGNOSIS — E785 Hyperlipidemia, unspecified: Secondary | ICD-10-CM | POA: Diagnosis not present

## 2016-05-11 DIAGNOSIS — G43909 Migraine, unspecified, not intractable, without status migrainosus: Secondary | ICD-10-CM | POA: Diagnosis not present

## 2016-05-11 DIAGNOSIS — Z124 Encounter for screening for malignant neoplasm of cervix: Secondary | ICD-10-CM | POA: Diagnosis not present

## 2016-05-11 DIAGNOSIS — F418 Other specified anxiety disorders: Secondary | ICD-10-CM | POA: Diagnosis not present

## 2016-05-11 DIAGNOSIS — Z0000 Encounter for general adult medical examination without abnormal findings: Secondary | ICD-10-CM | POA: Diagnosis not present

## 2016-05-11 DIAGNOSIS — M8580 Other specified disorders of bone density and structure, unspecified site: Secondary | ICD-10-CM | POA: Diagnosis not present

## 2016-05-11 DIAGNOSIS — M150 Primary generalized (osteo)arthritis: Secondary | ICD-10-CM | POA: Diagnosis not present

## 2016-08-01 DIAGNOSIS — H903 Sensorineural hearing loss, bilateral: Secondary | ICD-10-CM | POA: Diagnosis not present

## 2016-08-08 ENCOUNTER — Other Ambulatory Visit: Payer: Self-pay | Admitting: Family Medicine

## 2016-08-08 DIAGNOSIS — Z1231 Encounter for screening mammogram for malignant neoplasm of breast: Secondary | ICD-10-CM

## 2016-08-22 ENCOUNTER — Ambulatory Visit
Admission: RE | Admit: 2016-08-22 | Discharge: 2016-08-22 | Disposition: A | Discharge status: Home / Self Care | Payer: Medicare Other | Source: Ambulatory Visit | Attending: Family Medicine | Admitting: Family Medicine

## 2016-08-22 DIAGNOSIS — Z1231 Encounter for screening mammogram for malignant neoplasm of breast: Secondary | ICD-10-CM

## 2016-09-12 DIAGNOSIS — L814 Other melanin hyperpigmentation: Secondary | ICD-10-CM | POA: Diagnosis not present

## 2016-09-12 DIAGNOSIS — D225 Melanocytic nevi of trunk: Secondary | ICD-10-CM | POA: Diagnosis not present

## 2016-09-12 DIAGNOSIS — D485 Neoplasm of uncertain behavior of skin: Secondary | ICD-10-CM | POA: Diagnosis not present

## 2016-09-12 DIAGNOSIS — L821 Other seborrheic keratosis: Secondary | ICD-10-CM | POA: Diagnosis not present

## 2016-09-12 DIAGNOSIS — L570 Actinic keratosis: Secondary | ICD-10-CM | POA: Diagnosis not present

## 2016-09-28 DIAGNOSIS — L570 Actinic keratosis: Secondary | ICD-10-CM | POA: Diagnosis not present

## 2016-09-28 DIAGNOSIS — L308 Other specified dermatitis: Secondary | ICD-10-CM | POA: Diagnosis not present

## 2016-12-06 DIAGNOSIS — H04123 Dry eye syndrome of bilateral lacrimal glands: Secondary | ICD-10-CM | POA: Diagnosis not present

## 2016-12-06 DIAGNOSIS — Z961 Presence of intraocular lens: Secondary | ICD-10-CM | POA: Diagnosis not present

## 2016-12-27 DIAGNOSIS — Z23 Encounter for immunization: Secondary | ICD-10-CM | POA: Diagnosis not present

## 2017-03-01 DIAGNOSIS — M5430 Sciatica, unspecified side: Secondary | ICD-10-CM | POA: Diagnosis not present

## 2017-03-07 DIAGNOSIS — M5416 Radiculopathy, lumbar region: Secondary | ICD-10-CM | POA: Diagnosis not present

## 2017-03-12 DIAGNOSIS — M5416 Radiculopathy, lumbar region: Secondary | ICD-10-CM | POA: Diagnosis not present

## 2017-03-14 DIAGNOSIS — M5416 Radiculopathy, lumbar region: Secondary | ICD-10-CM | POA: Diagnosis not present

## 2017-03-18 DIAGNOSIS — M5416 Radiculopathy, lumbar region: Secondary | ICD-10-CM | POA: Diagnosis not present

## 2017-03-21 DIAGNOSIS — M5416 Radiculopathy, lumbar region: Secondary | ICD-10-CM | POA: Diagnosis not present

## 2017-03-26 DIAGNOSIS — M5416 Radiculopathy, lumbar region: Secondary | ICD-10-CM | POA: Diagnosis not present

## 2017-03-29 DIAGNOSIS — M5416 Radiculopathy, lumbar region: Secondary | ICD-10-CM | POA: Diagnosis not present

## 2017-05-23 DIAGNOSIS — F418 Other specified anxiety disorders: Secondary | ICD-10-CM | POA: Diagnosis not present

## 2017-05-23 DIAGNOSIS — M150 Primary generalized (osteo)arthritis: Secondary | ICD-10-CM | POA: Diagnosis not present

## 2017-05-23 DIAGNOSIS — Z0000 Encounter for general adult medical examination without abnormal findings: Secondary | ICD-10-CM | POA: Diagnosis not present

## 2017-05-23 DIAGNOSIS — E785 Hyperlipidemia, unspecified: Secondary | ICD-10-CM | POA: Diagnosis not present

## 2017-05-23 DIAGNOSIS — G43909 Migraine, unspecified, not intractable, without status migrainosus: Secondary | ICD-10-CM | POA: Diagnosis not present

## 2017-05-23 DIAGNOSIS — M8580 Other specified disorders of bone density and structure, unspecified site: Secondary | ICD-10-CM | POA: Diagnosis not present

## 2017-06-28 DIAGNOSIS — Z111 Encounter for screening for respiratory tuberculosis: Secondary | ICD-10-CM | POA: Diagnosis not present

## 2017-08-22 DIAGNOSIS — M8588 Other specified disorders of bone density and structure, other site: Secondary | ICD-10-CM | POA: Diagnosis not present

## 2017-08-30 ENCOUNTER — Other Ambulatory Visit: Payer: Self-pay | Admitting: Family Medicine

## 2017-08-30 DIAGNOSIS — Z1231 Encounter for screening mammogram for malignant neoplasm of breast: Secondary | ICD-10-CM

## 2017-09-19 ENCOUNTER — Ambulatory Visit: Payer: Medicare Other

## 2017-09-20 ENCOUNTER — Ambulatory Visit
Admission: RE | Admit: 2017-09-20 | Discharge: 2017-09-20 | Disposition: A | Discharge status: Home / Self Care | Payer: Medicare Other | Source: Ambulatory Visit | Attending: Family Medicine | Admitting: Family Medicine

## 2017-09-20 DIAGNOSIS — Z1231 Encounter for screening mammogram for malignant neoplasm of breast: Secondary | ICD-10-CM | POA: Diagnosis not present

## 2018-03-11 DIAGNOSIS — R05 Cough: Secondary | ICD-10-CM | POA: Diagnosis not present

## 2018-03-11 DIAGNOSIS — J069 Acute upper respiratory infection, unspecified: Secondary | ICD-10-CM | POA: Diagnosis not present

## 2018-08-19 ENCOUNTER — Other Ambulatory Visit: Payer: Self-pay | Admitting: Family Medicine

## 2018-08-19 DIAGNOSIS — E785 Hyperlipidemia, unspecified: Secondary | ICD-10-CM | POA: Diagnosis not present

## 2018-08-19 DIAGNOSIS — Z800 Family history of malignant neoplasm of digestive organs: Secondary | ICD-10-CM | POA: Diagnosis not present

## 2018-08-19 DIAGNOSIS — G43909 Migraine, unspecified, not intractable, without status migrainosus: Secondary | ICD-10-CM | POA: Diagnosis not present

## 2018-08-19 DIAGNOSIS — Z0000 Encounter for general adult medical examination without abnormal findings: Secondary | ICD-10-CM | POA: Diagnosis not present

## 2018-08-19 DIAGNOSIS — M8580 Other specified disorders of bone density and structure, unspecified site: Secondary | ICD-10-CM | POA: Diagnosis not present

## 2018-08-19 DIAGNOSIS — F418 Other specified anxiety disorders: Secondary | ICD-10-CM | POA: Diagnosis not present

## 2018-08-19 DIAGNOSIS — Z1231 Encounter for screening mammogram for malignant neoplasm of breast: Secondary | ICD-10-CM

## 2018-08-19 DIAGNOSIS — M150 Primary generalized (osteo)arthritis: Secondary | ICD-10-CM | POA: Diagnosis not present

## 2018-08-19 DIAGNOSIS — H9193 Unspecified hearing loss, bilateral: Secondary | ICD-10-CM | POA: Diagnosis not present

## 2018-08-19 DIAGNOSIS — Z1389 Encounter for screening for other disorder: Secondary | ICD-10-CM | POA: Diagnosis not present

## 2018-08-20 DIAGNOSIS — M79674 Pain in right toe(s): Secondary | ICD-10-CM | POA: Diagnosis not present

## 2018-08-20 DIAGNOSIS — M79675 Pain in left toe(s): Secondary | ICD-10-CM | POA: Diagnosis not present

## 2018-08-20 DIAGNOSIS — B351 Tinea unguium: Secondary | ICD-10-CM | POA: Diagnosis not present

## 2018-10-06 ENCOUNTER — Other Ambulatory Visit: Payer: Self-pay

## 2018-10-06 ENCOUNTER — Ambulatory Visit
Admission: RE | Admit: 2018-10-06 | Discharge: 2018-10-06 | Disposition: A | Discharge status: Home / Self Care | Payer: Medicare Other | Source: Ambulatory Visit | Attending: Family Medicine | Admitting: Family Medicine

## 2018-10-06 DIAGNOSIS — Z1231 Encounter for screening mammogram for malignant neoplasm of breast: Secondary | ICD-10-CM

## 2018-10-16 ENCOUNTER — Encounter: Payer: Self-pay | Admitting: Internal Medicine

## 2018-10-21 ENCOUNTER — Ambulatory Visit: Payer: Medicare Other

## 2018-10-21 ENCOUNTER — Other Ambulatory Visit: Payer: Self-pay

## 2018-10-21 DIAGNOSIS — Z800 Family history of malignant neoplasm of digestive organs: Secondary | ICD-10-CM

## 2018-10-21 DIAGNOSIS — Z86010 Personal history of colonic polyps: Secondary | ICD-10-CM

## 2018-10-21 NOTE — Progress Notes (Signed)
Per pt, no allergies to soy or egg products.Pt not taking any weight loss meds or using  O2 at home.   Pt denies any sedation problems  Pt refused emmi video.   The PV was done over the phone due to COVID-19. Verified pt's insurance and address. Reviewed pt's medical hx and prep instructions and will mail instructions to pt. Informed pt to call with any questions or changes prior to her procedure. She understood.

## 2018-10-29 DIAGNOSIS — M79674 Pain in right toe(s): Secondary | ICD-10-CM | POA: Diagnosis not present

## 2018-10-29 DIAGNOSIS — M79675 Pain in left toe(s): Secondary | ICD-10-CM | POA: Diagnosis not present

## 2018-10-29 DIAGNOSIS — B351 Tinea unguium: Secondary | ICD-10-CM | POA: Diagnosis not present

## 2018-11-03 ENCOUNTER — Telehealth: Payer: Self-pay

## 2018-11-03 NOTE — Telephone Encounter (Signed)
Covid-19 screening questions   Do you now or have you had a fever in the last 14 days? NO   Do you have any respiratory symptoms of shortness of breath or cough now or in the last 14 days? NO  Do you have any family members or close contacts with diagnosed or suspected Covid-19 in the past 14 days? NO  Have you been tested for Covid-19 and found to be positive? NO        

## 2018-11-04 ENCOUNTER — Ambulatory Visit (AMBULATORY_SURGERY_CENTER): Payer: Medicare Other | Admitting: Internal Medicine

## 2018-11-04 ENCOUNTER — Other Ambulatory Visit: Payer: Self-pay

## 2018-11-04 ENCOUNTER — Encounter: Payer: Medicare Other | Admitting: Internal Medicine

## 2018-11-04 ENCOUNTER — Telehealth: Payer: Self-pay | Admitting: Gastroenterology

## 2018-11-04 ENCOUNTER — Encounter: Payer: Self-pay | Admitting: Internal Medicine

## 2018-11-04 VITALS — BP 141/92 | HR 73 | Temp 98.2°F | Resp 13 | Ht 64.0 in | Wt 190.0 lb

## 2018-11-04 DIAGNOSIS — Z800 Family history of malignant neoplasm of digestive organs: Secondary | ICD-10-CM | POA: Diagnosis not present

## 2018-11-04 DIAGNOSIS — Z1211 Encounter for screening for malignant neoplasm of colon: Secondary | ICD-10-CM | POA: Diagnosis not present

## 2018-11-04 DIAGNOSIS — Z86010 Personal history of colonic polyps: Secondary | ICD-10-CM

## 2018-11-04 MED ORDER — SODIUM CHLORIDE 0.9 % IV SOLN: 500.0000 mL | Freq: Once | INTRAVENOUS | Status: DC

## 2018-11-04 NOTE — Patient Instructions (Addendum)
Congratulations!  You graduate today - no more routine colonoscopy.  I did not see any signs of polyps or cancer and the preparation was excellent.  You do have some diverticulosis and mildly inflamed hemorrhoids (likely from the prep) - common and not usually a problem. Please read the handouts provided.  I appreciate the opportunity to care for you. Gatha Mayer, MD, [facility]    Information on diverticulosis & hemorrhoids given to you today    YOU HAD AN ENDOSCOPIC PROCEDURE TODAY AT [facility]:   Refer to the procedure report that was given to you for any specific questions about what was found during the examination.  If the procedure report does not answer your questions, please call your gastroenterologist to clarify.  If you requested that your care partner not be given the details of your procedure findings, then the procedure report has been included in a sealed envelope for you to review at your convenience later.  YOU SHOULD EXPECT: Some feelings of bloating in the abdomen. Passage of more gas than usual.  Walking can help get rid of the air that was put into your GI tract during the procedure and reduce the bloating. If you had a lower endoscopy (such as a colonoscopy or flexible sigmoidoscopy) you may notice spotting of blood in your stool or on the toilet paper. If you underwent a bowel prep for your procedure, you may not have a normal bowel movement for a few days.  Please Note:  You might notice some irritation and congestion in your nose or some drainage.  This is from the oxygen used during your procedure.  There is no need for concern and it should clear up in a day or so.  SYMPTOMS TO REPORT IMMEDIATELY:   Following lower endoscopy (colonoscopy or flexible sigmoidoscopy):  Excessive amounts of blood in the stool  Significant tenderness or worsening of abdominal pains  Swelling of the abdomen that is new, acute  Fever of 100F or higher   For  urgent or emergent issues, a gastroenterologist can be reached at any hour by calling [phone].   DIET:  We do recommend a small meal at first, but then you may proceed to your regular diet.  Drink plenty of fluids but you should avoid alcoholic beverages for 24 hours.  ACTIVITY:  You should plan to take it easy for the rest of today and you should NOT DRIVE or use heavy machinery until tomorrow (because of the sedation medicines used during the test).    FOLLOW UP: Our staff will call the number listed on your records 48-72 hours following your procedure to check on you and address any questions or concerns that you may have regarding the information given to you following your procedure. If we do not reach you, we will leave a message.  We will attempt to reach you two times.  During this call, we will ask if you have developed any symptoms of COVID 19. If you develop any symptoms (ie: fever, flu-like symptoms, shortness of breath, cough etc.) before then, please call [phone].  If you test positive for Covid 19 in the 2 weeks post procedure, please call and report this information to [place].    If any biopsies were taken you will be contacted by phone or by letter within the next 1-3 weeks.  Please call us at [phone] if you have not heard about the biopsies in 3 weeks.    SIGNATURES/CONFIDENTIALITY: You and/or  your care partner have signed paperwork which will be entered into your electronic medical record.  These signatures attest to the fact that that the information above on your After Visit Summary has been reviewed and is understood.  Full responsibility of the confidentiality of this discharge information lies with you and/or your care-partner.

## 2018-11-04 NOTE — Op Note (Signed)
[facility] Patient Name: Ellsie Lawrence Procedure Date: 11/04/2018 11:24 AM MRN: [id number] Endoscopist: Gatha Mayer , MD Age: 78 Referring MD:  Date of Birth: Jul 04, 1940 Gender: Female Account #: [id number] Procedure:                Colonoscopy Indications:              Surveillance: Personal history of adenomatous                            polyps on last colonoscopy 5 years ago Medicines:                Propofol per Anesthesia, Monitored Anesthesia Care Procedure:                Pre-Anesthesia Assessment:                           - Prior to the procedure, a History and Physical                            was performed, and patient medications and                            allergies were reviewed. The patient's tolerance of                            previous anesthesia was also reviewed. The risks                            and benefits of the procedure and the sedation                            options and risks were discussed with the patient.                            All questions were answered, and informed consent                            was obtained. Prior Anticoagulants: The patient has                            taken no previous anticoagulant or antiplatelet                            agents. ASA Grade Assessment: II - A patient with                            mild systemic disease. After reviewing the risks                            and benefits, the patient was deemed in                            satisfactory condition to undergo the procedure.  After obtaining informed consent, the colonoscope                            was passed under direct vision. Throughout the                            procedure, the patient's blood pressure, pulse, and                            oxygen saturations were monitored continuously. The                            Colonoscope was introduced through the anus and   advanced to the the cecum, identified by                            appendiceal orifice and ileocecal valve. The                            colonoscopy was somewhat difficult due to                            significant looping. Successful completion of the                            procedure was aided by applying abdominal pressure.                            The patient tolerated the procedure fairly well.                            She did have some vomiting of bilious fluid that                            was suctioned w/o signs of aspiration. The quality                            of the bowel preparation was excellent. The bowel                            preparation used was Miralax via split dose                            instruction. The ileocecal valve, appendiceal                            orifice, and rectum were photographed. Scope In: 11:40:06 AM Scope Out: 11:57:03 AM Scope Withdrawal Time: 0 hours 9 minutes 47 seconds  Total Procedure Duration: 0 hours 16 minutes 57 seconds  Findings:                 The perianal and digital rectal examinations were  normal.                           Multiple diverticula were found in the left colon.                           External and internal hemorrhoids were found.                           The exam was otherwise without abnormality on                            direct and retroflexion views. Complications:            No immediate complications. Estimated Blood Loss:     Estimated blood loss: none. Impression:               - Diverticulosis in the left colon.                           - External and internal hemorrhoids.                           - The examination was otherwise normal on direct                            and retroflexion views.                           - No specimens collected.                           - Personal history of colonic polyps. And family hx                             CRCA in sister and father Recommendation:           - Patient has a contact number available for                            emergencies. The signs and symptoms of potential                            delayed complications were discussed with the                            patient. Return to normal activities tomorrow.                            Written discharge instructions were provided to the                            patient.                           - Resume previous diet.                           -  Continue present medications.                           - No repeat colonoscopy due to age and the absence                            of colonic polyps. Gatha Mayer, MD 11/04/2018 12:05:27 PM This report has been signed electronically.

## 2018-11-04 NOTE — Telephone Encounter (Signed)
Pt called at 7:30 this morning For colonoscopy this morning To arrive at 930 Had nausea/vomiting after first dose of MiraLAX. Stool is brown but liquid She had nausea with MiraLAX dose this morning.  I told her to take soda ([place] dry which she had at home) -that helped previously with nausea.  She did not have any antinausea medications at home.  Told her to go slow with the remaining MiraLAX dose.   I did tell her that she may need enema before colonoscopy.   Courtney Lawrence Please make sure that she is clear.   Thanks  RG

## 2018-11-04 NOTE — Progress Notes (Signed)
Pt's states no medical or surgical changes since previsit or office visit.  Temp-June bullock  Vital signs-courtney washington 

## 2018-11-06 ENCOUNTER — Telehealth: Payer: Self-pay | Admitting: *Deleted

## 2018-11-06 ENCOUNTER — Telehealth: Payer: Self-pay

## 2018-11-06 NOTE — Telephone Encounter (Signed)
1. Have you developed a fever since your procedure? no  2.   Have you had an respiratory symptoms (SOB or cough) since your procedure? no  3.   Have you tested positive for COVID 19 since your procedure no  4.   Have you had any family members/close contacts diagnosed with the COVID 19 since your procedure?  no   If yes to any of these questions please route to Joylene John, RN and Alphonsa Gin, Therapist, sports.  Follow up Call-  Call back number 11/04/2018  Post procedure Call Back phone  # [phone]  Permission to leave phone message Yes  Some recent data might be hidden     Patient questions:  Do you have a fever, pain , or abdominal swelling? No. Pain Score  0 *  Have you tolerated food without any problems? Yes.    Have you been able to return to your normal activities? Yes.    Do you have any questions about your discharge instructions: Diet   No. Medications  No. Follow up visit  No.  Do you have questions or concerns about your Care? No.  Actions: * If pain score is 4 or above: No action needed, pain <4.pt did c/o headache but not sure if it is from procedure/anesthesia or not and admitted she does suffer from migraines the patient says she has been drinking a lot but we did discuss need for rehydrating after procedure / anesthesia and to call us back if needed or does not improve.

## 2018-11-06 NOTE — Telephone Encounter (Signed)
  Follow up Call-  Call back number 11/04/2018  Post procedure Call Back phone  # [phone]  Permission to leave phone message Yes  Some recent data might be hidden     Patient questions:  Do you have a fever, pain , or abdominal swelling? No. Pain Score  0 *  Have you tolerated food without any problems? Yes.    Have you been able to return to your normal activities? Yes.    Do you have any questions about your discharge instructions: Diet   No. Medications  No. Follow up visit  No.  Do you have questions or concerns about your Care? No.  Actions: * If pain score is 4 or above: No action needed, pain <4.  1. Have you developed a fever since your procedure? no  2.   Have you had an respiratory symptoms (SOB or cough) since your procedure? no  3.   Have you tested positive for COVID 19 since your procedure no  4.   Have you had any family members/close contacts diagnosed with the COVID 19 since your procedure?  no   If yes to any of these questions please route to Joylene John, RN and Alphonsa Gin, Therapist, sports.

## 2018-12-30 DIAGNOSIS — B351 Tinea unguium: Secondary | ICD-10-CM | POA: Diagnosis not present

## 2018-12-30 DIAGNOSIS — M79675 Pain in left toe(s): Secondary | ICD-10-CM | POA: Diagnosis not present

## 2018-12-30 DIAGNOSIS — M79674 Pain in right toe(s): Secondary | ICD-10-CM | POA: Diagnosis not present

## 2019-03-03 DIAGNOSIS — Z23 Encounter for immunization: Secondary | ICD-10-CM | POA: Diagnosis not present

## 2019-03-31 DIAGNOSIS — B351 Tinea unguium: Secondary | ICD-10-CM | POA: Diagnosis not present

## 2019-03-31 DIAGNOSIS — M79675 Pain in left toe(s): Secondary | ICD-10-CM | POA: Diagnosis not present

## 2019-03-31 DIAGNOSIS — Z23 Encounter for immunization: Secondary | ICD-10-CM | POA: Diagnosis not present

## 2019-03-31 DIAGNOSIS — M79674 Pain in right toe(s): Secondary | ICD-10-CM | POA: Diagnosis not present

## 2019-04-13 DIAGNOSIS — L820 Inflamed seborrheic keratosis: Secondary | ICD-10-CM | POA: Diagnosis not present

## 2019-04-13 DIAGNOSIS — L821 Other seborrheic keratosis: Secondary | ICD-10-CM | POA: Diagnosis not present

## 2019-04-13 DIAGNOSIS — L570 Actinic keratosis: Secondary | ICD-10-CM | POA: Diagnosis not present

## 2019-04-13 DIAGNOSIS — D225 Melanocytic nevi of trunk: Secondary | ICD-10-CM | POA: Diagnosis not present

## 2019-04-13 DIAGNOSIS — D1801 Hemangioma of skin and subcutaneous tissue: Secondary | ICD-10-CM | POA: Diagnosis not present

## 2019-04-13 DIAGNOSIS — D2262 Melanocytic nevi of left upper limb, including shoulder: Secondary | ICD-10-CM | POA: Diagnosis not present

## 2019-04-13 DIAGNOSIS — C44519 Basal cell carcinoma of skin of other part of trunk: Secondary | ICD-10-CM | POA: Diagnosis not present

## 2019-04-13 DIAGNOSIS — D2261 Melanocytic nevi of right upper limb, including shoulder: Secondary | ICD-10-CM | POA: Diagnosis not present

## 2019-04-13 DIAGNOSIS — L814 Other melanin hyperpigmentation: Secondary | ICD-10-CM | POA: Diagnosis not present

## 2019-04-13 DIAGNOSIS — D0359 Melanoma in situ of other part of trunk: Secondary | ICD-10-CM | POA: Diagnosis not present

## 2019-04-15 DIAGNOSIS — L988 Other specified disorders of the skin and subcutaneous tissue: Secondary | ICD-10-CM | POA: Diagnosis not present

## 2019-04-15 DIAGNOSIS — D0359 Melanoma in situ of other part of trunk: Secondary | ICD-10-CM | POA: Diagnosis not present

## 2019-06-03 DIAGNOSIS — M79674 Pain in right toe(s): Secondary | ICD-10-CM | POA: Diagnosis not present

## 2019-06-03 DIAGNOSIS — B351 Tinea unguium: Secondary | ICD-10-CM | POA: Diagnosis not present

## 2019-06-03 DIAGNOSIS — M79675 Pain in left toe(s): Secondary | ICD-10-CM | POA: Diagnosis not present

## 2019-08-12 DIAGNOSIS — M79674 Pain in right toe(s): Secondary | ICD-10-CM | POA: Diagnosis not present

## 2019-08-12 DIAGNOSIS — B351 Tinea unguium: Secondary | ICD-10-CM | POA: Diagnosis not present

## 2019-08-12 DIAGNOSIS — M79675 Pain in left toe(s): Secondary | ICD-10-CM | POA: Diagnosis not present

## 2019-08-24 ENCOUNTER — Other Ambulatory Visit: Payer: Self-pay | Admitting: Family Medicine

## 2019-08-24 DIAGNOSIS — M8580 Other specified disorders of bone density and structure, unspecified site: Secondary | ICD-10-CM

## 2019-08-24 DIAGNOSIS — Z1231 Encounter for screening mammogram for malignant neoplasm of breast: Secondary | ICD-10-CM

## 2019-09-21 DIAGNOSIS — T8484XA Pain due to internal orthopedic prosthetic devices, implants and grafts, initial encounter: Secondary | ICD-10-CM | POA: Diagnosis not present

## 2019-09-21 DIAGNOSIS — M76821 Posterior tibial tendinitis, right leg: Secondary | ICD-10-CM | POA: Insufficient documentation

## 2019-09-21 DIAGNOSIS — M67873 Other specified disorders of tendon, right ankle and foot: Secondary | ICD-10-CM | POA: Diagnosis not present

## 2019-09-25 DIAGNOSIS — M67873 Other specified disorders of tendon, right ankle and foot: Secondary | ICD-10-CM | POA: Diagnosis not present

## 2019-10-05 DIAGNOSIS — M67873 Other specified disorders of tendon, right ankle and foot: Secondary | ICD-10-CM | POA: Diagnosis not present

## 2019-10-09 DIAGNOSIS — M67873 Other specified disorders of tendon, right ankle and foot: Secondary | ICD-10-CM | POA: Diagnosis not present

## 2019-10-12 DIAGNOSIS — M67873 Other specified disorders of tendon, right ankle and foot: Secondary | ICD-10-CM | POA: Diagnosis not present

## 2019-10-13 DIAGNOSIS — M79674 Pain in right toe(s): Secondary | ICD-10-CM | POA: Diagnosis not present

## 2019-10-13 DIAGNOSIS — M79675 Pain in left toe(s): Secondary | ICD-10-CM | POA: Diagnosis not present

## 2019-10-13 DIAGNOSIS — B351 Tinea unguium: Secondary | ICD-10-CM | POA: Diagnosis not present

## 2019-10-16 DIAGNOSIS — M67873 Other specified disorders of tendon, right ankle and foot: Secondary | ICD-10-CM | POA: Diagnosis not present

## 2019-10-19 DIAGNOSIS — M67873 Other specified disorders of tendon, right ankle and foot: Secondary | ICD-10-CM | POA: Diagnosis not present

## 2019-10-21 DIAGNOSIS — Z85820 Personal history of malignant melanoma of skin: Secondary | ICD-10-CM | POA: Diagnosis not present

## 2019-10-21 DIAGNOSIS — L821 Other seborrheic keratosis: Secondary | ICD-10-CM | POA: Diagnosis not present

## 2019-10-21 DIAGNOSIS — D0371 Melanoma in situ of right lower limb, including hip: Secondary | ICD-10-CM | POA: Diagnosis not present

## 2019-10-21 DIAGNOSIS — D485 Neoplasm of uncertain behavior of skin: Secondary | ICD-10-CM | POA: Diagnosis not present

## 2019-10-21 DIAGNOSIS — L570 Actinic keratosis: Secondary | ICD-10-CM | POA: Diagnosis not present

## 2019-10-21 DIAGNOSIS — D225 Melanocytic nevi of trunk: Secondary | ICD-10-CM | POA: Diagnosis not present

## 2019-10-21 DIAGNOSIS — D2261 Melanocytic nevi of right upper limb, including shoulder: Secondary | ICD-10-CM | POA: Diagnosis not present

## 2019-10-21 DIAGNOSIS — L814 Other melanin hyperpigmentation: Secondary | ICD-10-CM | POA: Diagnosis not present

## 2019-10-23 DIAGNOSIS — M67873 Other specified disorders of tendon, right ankle and foot: Secondary | ICD-10-CM | POA: Diagnosis not present

## 2019-10-27 DIAGNOSIS — M67873 Other specified disorders of tendon, right ankle and foot: Secondary | ICD-10-CM | POA: Diagnosis not present

## 2019-10-29 DIAGNOSIS — M67873 Other specified disorders of tendon, right ankle and foot: Secondary | ICD-10-CM | POA: Diagnosis not present

## 2019-11-03 ENCOUNTER — Ambulatory Visit (INDEPENDENT_AMBULATORY_CARE_PROVIDER_SITE_OTHER): Payer: Medicare Other | Admitting: Plastic Surgery

## 2019-11-03 ENCOUNTER — Other Ambulatory Visit: Payer: Self-pay

## 2019-11-03 ENCOUNTER — Encounter: Payer: Self-pay | Admitting: Plastic Surgery

## 2019-11-03 DIAGNOSIS — C439 Malignant melanoma of skin, unspecified: Secondary | ICD-10-CM

## 2019-11-03 DIAGNOSIS — L988 Other specified disorders of the skin and subcutaneous tissue: Secondary | ICD-10-CM

## 2019-11-03 HISTORY — DX: Malignant melanoma of skin, unspecified: C43.9

## 2019-11-03 HISTORY — DX: Other specified disorders of the skin and subcutaneous tissue: L98.8

## 2019-11-03 NOTE — Progress Notes (Signed)
Patient ID: Oneita Jolly, female    DOB: 1941/02/06, 79 y.o.   MRN: [id number]   Chief Complaint  Patient presents with  . Skin Problem    The patient is a 79 year old female here for evaluation of her skin.  The patient was seen by Harriett Sine and found to have a melanoma in situ of the right plantar foot after a shave biopsy.  She also has a dysplastic nevus with moderate to severe atypia of the right posterior arm.  Both areas have margins included.  The patient lives at [facility] with her husband who is recovering from a stroke.  She has a history of degenerative joint disease and hyperlipidemia.  Both areas are healing well.  There is no sign of infection.  They are approximately half a centimeter in size.  She also asked about some hyperpigmentation on her left cheek.  We can certainly do a biopsy of this if she wants.  With her history of melanoma I would not recommend using the laser until the biopsy is proven negative.  She was accompanied by her daughter who will be helping her postoperatively.   Review of Systems  Constitutional: Negative.   HENT: Negative.   Eyes: Negative.   Respiratory: Negative.   Cardiovascular: Negative.   Gastrointestinal: Negative.   Genitourinary: Negative.   Musculoskeletal: Negative.   Skin: Positive for color change and wound.  Neurological: Negative.   Hematological: Negative.   Psychiatric/Behavioral: Negative.     Past Medical History:  Diagnosis Date  . Allergy   . Anxiety   . Diverticulosis 10/28/2008  . DJD (degenerative joint disease)   . Hyperlipidemia   . Migraine   . Osteopenia   . Personal history of colonic polyp - adenoma 10/30/2013  . Posterior tibial tendonitis    left  . Seasonal allergies   . Urticaria, chronic     Past Surgical History:  Procedure Laterality Date  . CALCANEAL OSTEOTOMY Left 09/16/2014   Procedure: CALCANEAL OSTEOTOMY;  Surgeon: Wylene Simmer, MD;  Location: [facility];   Service: Orthopedics;  Laterality: Left;  . COLONOSCOPY  10/28/2008   Dr. Franki Cabot  . FOOT SURGERY Right 2012 ALSO 2003  . GASTROC RECESSION EXTREMITY Left 09/16/2014   Procedure: LEFT GASTROCNEMIUS RECESSION ;  Surgeon: Wylene Simmer, MD;  Location: [facility];  Service: Orthopedics;  Laterality: Left;  . JOINT REPLACEMENT Left    knee  . TOTAL KNEE ARTHROPLASTY  05/14/2011   Procedure: TOTAL KNEE ARTHROPLASTY;  Surgeon: Mauri Pole, MD;  Location: [facility];  Service: Orthopedics;  Laterality: Left;      Current Outpatient Medications:  .  CALCIUM-MAGNESIUM-VITAMIN D PO, Take 1 tablet by mouth 2 (two) times daily., Disp: , Rfl:  .  celecoxib (CELEBREX) 100 MG capsule, Take 100 mg by mouth daily., Disp: , Rfl:  .  Cholecalciferol (VITAMIN D-3) 5000 UNITS TABS, Take by mouth daily. , Disp: , Rfl:  .  Cyanocobalamin (VITAMIN B-12) 1000 MCG SUBL, Place under the tongue daily., Disp: , Rfl:  .  glucosamine-chondroitin 500-400 MG tablet, Take 1 tablet by mouth daily. , Disp: , Rfl:  .  Multiple Vitamins-Minerals (ICAPS PO), Take 1 tablet by mouth daily., Disp: , Rfl:  .  naratriptan (AMERGE) 2.5 MG tablet, Take 2.5 mg by mouth as needed for migraine. Take one (1) tablet at onset of headache; if returns or does not resolve, may repeat after 4 hours; do not exceed  five (5) mg in 24 hours., Disp: , Rfl:  .  sertraline (ZOLOFT) 50 MG tablet, Take 50 mg by mouth daily., Disp: , Rfl:  .  simvastatin (ZOCOR) 20 MG tablet, Take 20 mg by mouth daily., Disp: , Rfl:  .  oxyCODONE (ROXICODONE) 5 MG immediate release tablet, Take 1-2 tablets (5-10 mg total) by mouth every 4 (four) hours as needed for moderate pain or severe pain. (Patient not taking: Reported on 10/21/2018), Disp: 30 tablet, Rfl: 0 .  senna (SENOKOT) 8.6 MG TABS tablet, Take 2 tablets (17.2 mg total) by mouth 2 (two) times daily. (Patient not taking: Reported on 10/21/2018), Disp: 30 each, Rfl: 0   Objective:   Vitals:    11/03/19 1252  BP: 138/80  Pulse: 81  Temp: 98.7 F (37.1 C)  SpO2: 97%    Physical Exam Vitals and nursing note reviewed.  Constitutional:      Appearance: Normal appearance.  HENT:     Head: Normocephalic and atraumatic.  Cardiovascular:     Rate and Rhythm: Normal rate.     Pulses: Normal pulses.  Pulmonary:     Effort: Pulmonary effort is normal.  Abdominal:     General: Abdomen is flat. There is no distension.     Tenderness: There is no abdominal tenderness.  Musculoskeletal:       Arms:       Feet:  Skin:    General: Skin is warm.     Capillary Refill: Capillary refill takes less than 2 seconds.  Neurological:     General: No focal deficit present.     Mental Status: She is alert and oriented to person, place, and time.  Psychiatric:        Mood and Affect: Mood normal.        Behavior: Behavior normal.        Thought Content: Thought content normal.     Assessment & Plan:  Melanoma of skin ([place])  Dysplasia of skin  Recommend reexcision of melanoma in situ of right foot and dysplasia of the right arm.  We will hopefully be able to use [employer]Cell while we wait for the margins to clear on her foot.  I should be able to get her arm closed.  I recommend she use a scooter or crutches or a wheelchair and keep off of her right foot while it is healing.  Pictures were obtained of the patient and placed in the chart with the patient's or guardian's permission.   [place], DO

## 2019-11-05 ENCOUNTER — Telehealth: Payer: Self-pay | Admitting: *Deleted

## 2019-11-05 NOTE — Telephone Encounter (Signed)
Faxed order to [facility] for medical supplies for the patient.  Confirmation received and copy scanned into the chart.//AB/CMA   Supplies:Roll gauze                Gauze                 [place] gel                  Ace wrap-4 inches

## 2019-11-13 ENCOUNTER — Other Ambulatory Visit: Payer: Self-pay

## 2019-11-13 ENCOUNTER — Encounter (HOSPITAL_BASED_OUTPATIENT_CLINIC_OR_DEPARTMENT_OTHER): Payer: Self-pay | Admitting: Plastic Surgery

## 2019-11-16 ENCOUNTER — Other Ambulatory Visit (HOSPITAL_COMMUNITY)
Admission: RE | Admit: 2019-11-16 | Discharge: 2019-11-16 | Disposition: A | Discharge status: Home / Self Care | Payer: Medicare Other | Source: Ambulatory Visit | Attending: Plastic Surgery | Admitting: Plastic Surgery

## 2019-11-16 ENCOUNTER — Encounter: Payer: Medicare Other | Admitting: Plastic Surgery

## 2019-11-16 DIAGNOSIS — Z20822 Contact with and (suspected) exposure to covid-19: Secondary | ICD-10-CM | POA: Insufficient documentation

## 2019-11-16 DIAGNOSIS — Z01812 Encounter for preprocedural laboratory examination: Secondary | ICD-10-CM | POA: Diagnosis not present

## 2019-11-16 LAB — SARS CORONAVIRUS 2 (TAT 6-24 HRS): SARS Coronavirus 2: NEGATIVE

## 2019-11-16 NOTE — Progress Notes (Signed)
ICD-10-CM   1. Melanoma of skin ([place])  C43.9   2. Dysplasia of skin  L98.8       Patient ID: Courtney Lawrence, female    DOB: 08/21/1940, 79 y.o.   MRN: [id number]   History of Present Illness: Courtney Lawrence is a 79 y.o.  female  with a history of melanoma in situ of the right plantar foot and a dysplastic nevus with moderate to severe atypia of the right posterior arm.  She presents for preoperative evaluation for upcoming procedure, Excision of right foot melanoma and right arm dysplasia with placement of Acell, scheduled for 11/19/19 with Dr. Marla Roe.  Summary from previous visit: Patient was seen by Harriett Sine and found to have a melanoma in situ of the right plantar foot. She also has a dysplastic nevus with moderate to severe atypia of the right posterior arm. Both areas have margins involved. Patient lives at [facility] with her husband who is recovering from a stroke. Recommendation that she use a scooter or crutches or a wheelchair to keep off her right foot while it is healing.  PMH Significant for: Degenerative joint disease, HLD  The patient has had problems with anesthesia.  PONV -use patch if possible.  Past Medical History: Allergies: Allergies  Allergen Reactions  . Platinum-Containing Compounds     swelling  . Codeine Nausea And Vomiting  . Aspirin Hives and Other (See Comments)    Headache   . Nickel Rash    GETS "INFECTION" WITH NICKEL EARRINGS  . Penicillins     Per pt: family hx allergy to PCN  . Yellow Dyes (Non-Tartrazine) Hives    Current Medications:  Current Outpatient Medications:  .  CALCIUM-MAGNESIUM-VITAMIN D PO, Take 1 tablet by mouth 2 (two) times daily., Disp: , Rfl:  .  celecoxib (CELEBREX) 100 MG capsule, Take 100 mg by mouth daily., Disp: , Rfl:  .  Cholecalciferol (VITAMIN D-3) 5000 UNITS TABS, Take by mouth daily. , Disp: , Rfl:  .  Cyanocobalamin (VITAMIN B-12) 1000 MCG SUBL, Place under the tongue daily., Disp: , Rfl:  .   glucosamine-chondroitin 500-400 MG tablet, Take 1 tablet by mouth daily. , Disp: , Rfl:  .  Multiple Vitamins-Minerals (ICAPS PO), Take 1 tablet by mouth daily., Disp: , Rfl:  .  naratriptan (AMERGE) 2.5 MG tablet, Take 2.5 mg by mouth as needed for migraine. Take one (1) tablet at onset of headache; if returns or does not resolve, may repeat after 4 hours; do not exceed five (5) mg in 24 hours., Disp: , Rfl:  .  sertraline (ZOLOFT) 50 MG tablet, Take 50 mg by mouth daily., Disp: , Rfl:  .  simvastatin (ZOCOR) 20 MG tablet, Take 20 mg by mouth daily., Disp: , Rfl:   Past Medical Problems: Past Medical History:  Diagnosis Date  . Allergy   . Anxiety   . Diverticulosis 10/28/2008  . DJD (degenerative joint disease)   . Hyperlipidemia   . Migraine   . Osteopenia   . Personal history of colonic polyp - adenoma 10/30/2013  . PONV (postoperative nausea and vomiting)   . Posterior tibial tendonitis    left  . Seasonal allergies   . Urticaria, chronic     Past Surgical History: Past Surgical History:  Procedure Laterality Date  . CALCANEAL OSTEOTOMY Left 09/16/2014   Procedure: CALCANEAL OSTEOTOMY;  Surgeon: Wylene Simmer, MD;  Location: [place];  Service: Orthopedics;  Laterality: Left;  .  COLONOSCOPY  10/28/2008   Dr. Franki Cabot  . FOOT SURGERY Right 2012 ALSO 2003  . GASTROC RECESSION EXTREMITY Left 09/16/2014   Procedure: LEFT GASTROCNEMIUS RECESSION ;  Surgeon: Wylene Simmer, MD;  Location: [place];  Service: Orthopedics;  Laterality: Left;  . JOINT REPLACEMENT Left    knee  . TOTAL KNEE ARTHROPLASTY  05/14/2011   Procedure: TOTAL KNEE ARTHROPLASTY;  Surgeon: Mauri Pole, MD;  Location: [facility];  Service: Orthopedics;  Laterality: Left;    Social History: Social History   Socioeconomic History  . Marital status: Married    Spouse name: Not on file  . Number of children: Not on file  . Years of education: Not on file  . Highest education  level: Not on file  Occupational History  . Occupation: president [employer]  Tobacco Use  . Smoking status: Never Smoker  . Smokeless tobacco: Never Used  Substance and Sexual Activity  . Alcohol use: Yes    Alcohol/week: 1.0 standard drink    Types: 1 Standard drinks or equivalent per week  . Drug use: No  . Sexual activity: Not on file  Other Topics Concern  . Not on file  Social History Narrative   Married, she is retired Environmental education officer. One son two daughters Courtney Lawrence is one)   . 2-3 caffeinated drinks a day.   Social Determinants of Health   Financial Resource Strain:   . Difficulty of Paying Living Expenses: Not on file  Food Insecurity:   . Worried About Charity fundraiser in the Last Year: Not on file  . Ran Out of Food in the Last Year: Not on file  Transportation Needs:   . Lack of Transportation (Medical): Not on file  . Lack of Transportation (Non-Medical): Not on file  Physical Activity:   . Days of Exercise per Week: Not on file  . Minutes of Exercise per Session: Not on file  Stress:   . Feeling of Stress : Not on file  Social Connections:   . Frequency of Communication with Friends and Family: Not on file  . Frequency of Social Gatherings with Friends and Family: Not on file  . Attends Religious Services: Not on file  . Active Member of Clubs or Organizations: Not on file  . Attends Archivist Meetings: Not on file  . Marital Status: Not on file  Intimate Partner Violence:   . Fear of Current or Ex-Partner: Not on file  . Emotionally Abused: Not on file  . Physically Abused: Not on file  . Sexually Abused: Not on file    Family History: Family History  Problem Relation Age of Onset  . Colon cancer Father 55  . Heart attack Father   . Coronary artery disease Father   . Colon cancer Sister 75  . Rheum arthritis Mother   . Diabetes Paternal Grandfather   . Breast cancer Maternal Grandfather   . Breast cancer Other   .  Esophageal cancer Neg Hx   . Stomach cancer Neg Hx   . Rectal cancer Neg Hx     Review of Systems: Review of Systems  Constitutional: Negative for chills and fever.  HENT: Negative for congestion and sore throat.   Respiratory: Negative for cough and shortness of breath.   Cardiovascular: Negative for chest pain and palpitations.  Gastrointestinal: Negative for abdominal pain, nausea and vomiting.  Skin: Negative for itching and rash.       Melanoma on  right plantar foot; dysplastic nevus on posterior right arm    Physical Exam: Vital Signs BP (!) 149/79 (BP Location: Left Arm, Patient Position: Sitting, Cuff Size: Large)   Pulse 71   Temp 98.2 F (36.8 C) (Temporal)   Ht 5\' 3"  (1.6 m)   Wt 201 lb (91.2 kg)   SpO2 97%   BMI 35.61 kg/m  Physical Exam Vitals and nursing note reviewed.  Constitutional:      General: She is not in acute distress.    Appearance: Normal appearance. She is obese. She is not ill-appearing.  HENT:     Head: Normocephalic and atraumatic.  Eyes:     Extraocular Movements: Extraocular movements intact.  Cardiovascular:     Rate and Rhythm: Normal rate and regular rhythm.     Pulses: Normal pulses.     Heart sounds: Normal heart sounds.  Pulmonary:     Effort: Pulmonary effort is normal.     Breath sounds: Normal breath sounds. No wheezing, rhonchi or rales.  Abdominal:     General: Bowel sounds are normal.     Palpations: Abdomen is soft.  Musculoskeletal:        General: No swelling. Normal range of motion.     Cervical back: Normal range of motion.  Skin:    General: Skin is warm and dry.     Coloration: Skin is not pale.     Findings: No erythema or rash.     Comments: Melanoma on right plantar foot; dysplastic nevus on right posterior arm  Neurological:     General: No focal deficit present.     Mental Status: She is alert and oriented to person, place, and time.  Psychiatric:        Mood and Affect: Mood normal.        Behavior:  Behavior normal.        Thought Content: Thought content normal.        Judgment: Judgment normal.     Assessment/Plan:  Courtney Lawrence scheduled for excision of right foot melanoma, excision of right arm dysplasia, and application of ACell with Dr. Marla Roe.  Risks, benefits, and alternatives of procedure discussed, questions answered and consent obtained.    Smoking Status: Non-smoker; Counseling Given?  N/A  Caprini Score: 8 High; Risk Factors include: 79 year old female, melanoma, BMI > 25, and length of planned surgery. Recommendation for mechanical and pharmacological prophylaxis during surgery. Encourage early ambulation.   Pictures obtained: 11/03/2019  Post-op Rx sent to pharmacy: Tramadol, Zofran, Bactrim  Patient was provided with the General Surgical Risk consent document and Pain Medication Agreement prior to their appointment.  They had adequate time to read through the risk consent documents and Pain Medication Agreement. We also discussed them in person together during this preop appointment. All of their questions were answered to their satisfaction.  Recommended calling if they have any further questions.  Risk consent form and Pain Medication Agreement to be scanned into patient's chart.  The [place] was signed into law in 2016 which includes the topic of electronic health records.  This provides immediate access to information in MyChart.  This includes consultation notes, operative notes, office notes, lab results and pathology reports.  If you have any questions about what you read please let us know at your next visit or call us at the office.  We are right here with you.   Electronically signed by: Threasa Heads, PA-C 11/17/2019 4:44 PM

## 2019-11-16 NOTE — H&P (View-Only) (Signed)
ICD-10-CM   1. Melanoma of skin ([place])  C43.9   2. Dysplasia of skin  L98.8       Patient ID: Courtney Lawrence, female    DOB: [birth year], 79 y.o.   MRN: [id number]   History of Present Illness: Courtney Lawrence is a 79 y.o.  female  with a history of melanoma in situ of the right plantar foot and a dysplastic nevus with moderate to severe atypia of the right posterior arm.  She presents for preoperative evaluation for upcoming procedure, Excision of right foot melanoma and right arm dysplasia with placement of Acell, scheduled for 11/19/19 with Dr. Marla Roe.  Summary from previous visit: Patient was seen by Harriett Sine and found to have a melanoma in situ of the right plantar foot. She also has a dysplastic nevus with moderate to severe atypia of the right posterior arm. Both areas have margins involved. Patient lives at [facility] with her husband who is recovering from a stroke. Recommendation that she use a scooter or crutches or a wheelchair to keep off her right foot while it is healing.  PMH Significant for: Degenerative joint disease, HLD  The patient has had problems with anesthesia.  PONV -use patch if possible.  Past Medical History: Allergies: Allergies  Allergen Reactions  . Platinum-Containing Compounds     swelling  . Codeine Nausea And Vomiting  . Aspirin Hives and Other (See Comments)    Headache   . Nickel Rash    GETS "INFECTION" WITH NICKEL EARRINGS  . Penicillins     Per pt: family hx allergy to PCN  . Yellow Dyes (Non-Tartrazine) Hives    Current Medications:  Current Outpatient Medications:  .  CALCIUM-MAGNESIUM-VITAMIN D PO, Take 1 tablet by mouth 2 (two) times daily., Disp: , Rfl:  .  celecoxib (CELEBREX) 100 MG capsule, Take 100 mg by mouth daily., Disp: , Rfl:  .  Cholecalciferol (VITAMIN D-3) 5000 UNITS TABS, Take by mouth daily. , Disp: , Rfl:  .  Cyanocobalamin (VITAMIN B-12) 1000 MCG SUBL, Place under the tongue daily., Disp: , Rfl:  .   glucosamine-chondroitin 500-400 MG tablet, Take 1 tablet by mouth daily. , Disp: , Rfl:  .  Multiple Vitamins-Minerals (ICAPS PO), Take 1 tablet by mouth daily., Disp: , Rfl:  .  naratriptan (AMERGE) 2.5 MG tablet, Take 2.5 mg by mouth as needed for migraine. Take one (1) tablet at onset of headache; if returns or does not resolve, may repeat after 4 hours; do not exceed five (5) mg in 24 hours., Disp: , Rfl:  .  sertraline (ZOLOFT) 50 MG tablet, Take 50 mg by mouth daily., Disp: , Rfl:  .  simvastatin (ZOCOR) 20 MG tablet, Take 20 mg by mouth daily., Disp: , Rfl:   Past Medical Problems: Past Medical History:  Diagnosis Date  . Allergy   . Anxiety   . Diverticulosis 10/28/2008  . DJD (degenerative joint disease)   . Hyperlipidemia   . Migraine   . Osteopenia   . Personal history of colonic polyp - adenoma 10/30/2013  . PONV (postoperative nausea and vomiting)   . Posterior tibial tendonitis    left  . Seasonal allergies   . Urticaria, chronic     Past Surgical History: Past Surgical History:  Procedure Laterality Date  . CALCANEAL OSTEOTOMY Left 09/16/2014   Procedure: CALCANEAL OSTEOTOMY;  Surgeon: Wylene Simmer, MD;  Location: [place];  Service: Orthopedics;  Laterality: Left;  .  COLONOSCOPY  10/28/2008   Dr. Franki Cabot  . FOOT SURGERY Right 2012 ALSO 2003  . GASTROC RECESSION EXTREMITY Left 09/16/2014   Procedure: LEFT GASTROCNEMIUS RECESSION ;  Surgeon: Wylene Simmer, MD;  Location: [place];  Service: Orthopedics;  Laterality: Left;  . JOINT REPLACEMENT Left    knee  . TOTAL KNEE ARTHROPLASTY  05/14/2011   Procedure: TOTAL KNEE ARTHROPLASTY;  Surgeon: Mauri Pole, MD;  Location: [facility];  Service: Orthopedics;  Laterality: Left;    Social History: Social History   Socioeconomic History  . Marital status: Married    Spouse name: Not on file  . Number of children: Not on file  . Years of education: Not on file  . Highest education  level: Not on file  Occupational History  . Occupation: president [employer]  Tobacco Use  . Smoking status: Never Smoker  . Smokeless tobacco: Never Used  Substance and Sexual Activity  . Alcohol use: Yes    Alcohol/week: 1.0 standard drink    Types: 1 Standard drinks or equivalent per week  . Drug use: No  . Sexual activity: Not on file  Other Topics Concern  . 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 2-3 caffeinated drinks a day.   Social Determinants of Health   Financial Resource Strain:   . Difficulty of Paying Living Expenses: Not on file  Food Insecurity:   . Worried About Charity fundraiser in the Last Year: Not on file  . Ran Out of Food in the Last Year: Not on file  Transportation Needs:   . Lack of Transportation (Medical): Not on file  . Lack of Transportation (Non-Medical): Not on file  Physical Activity:   . Days of Exercise per Week: Not on file  . Minutes of Exercise per Session: Not on file  Stress:   . Feeling of Stress : Not on file  Social Connections:   . Frequency of Communication with Friends and Family: Not on file  . Frequency of Social Gatherings with Friends and Family: Not on file  . Attends Religious Services: Not on file  . Active Member of Clubs or Organizations: Not on file  . Attends Archivist Meetings: Not on file  . Marital Status: Not on file  Intimate Partner Violence:   . Fear of Current or Ex-Partner: Not on file  . Emotionally Abused: Not on file  . Physically Abused: Not on file  . Sexually Abused: Not on file    Family History: Family History  Problem Relation Age of Onset  . Colon cancer Father 85  . Heart attack Father   . Coronary artery disease Father   . Colon cancer Sister 55  . Rheum arthritis Mother   . Diabetes Paternal Grandfather   . Breast cancer Maternal Grandfather   . Breast cancer Other   .  Esophageal cancer Neg Hx   . Stomach cancer Neg Hx   . Rectal cancer Neg Hx     Review of Systems: Review of Systems  Constitutional: Negative for chills and fever.  HENT: Negative for congestion and sore throat.   Respiratory: Negative for cough and shortness of breath.   Cardiovascular: Negative for chest pain and palpitations.  Gastrointestinal: Negative for abdominal pain, nausea and vomiting.  Skin: Negative for itching and rash.       Melanoma on  right plantar foot; dysplastic nevus on posterior right arm    Physical Exam: Vital Signs BP (!) 149/79 (BP Location: Left Arm, Patient Position: Sitting, Cuff Size: Large)   Pulse 71   Temp 98.2 F (36.8 C) (Temporal)   Ht 5\' 3"  (1.6 m)   Wt 201 lb (91.2 kg)   SpO2 97%   BMI 35.61 kg/m  Physical Exam Vitals and nursing note reviewed.  Constitutional:      General: She is not in acute distress.    Appearance: Normal appearance. She is obese. She is not ill-appearing.  HENT:     Head: Normocephalic and atraumatic.  Eyes:     Extraocular Movements: Extraocular movements intact.  Cardiovascular:     Rate and Rhythm: Normal rate and regular rhythm.     Pulses: Normal pulses.     Heart sounds: Normal heart sounds.  Pulmonary:     Effort: Pulmonary effort is normal.     Breath sounds: Normal breath sounds. No wheezing, rhonchi or rales.  Abdominal:     General: Bowel sounds are normal.     Palpations: Abdomen is soft.  Musculoskeletal:        General: No swelling. Normal range of motion.     Cervical back: Normal range of motion.  Skin:    General: Skin is warm and dry.     Coloration: Skin is not pale.     Findings: No erythema or rash.     Comments: Melanoma on right plantar foot; dysplastic nevus on right posterior arm  Neurological:     General: No focal deficit present.     Mental Status: She is alert and oriented to person, place, and time.  Psychiatric:        Mood and Affect: Mood normal.        Behavior:  Behavior normal.        Thought Content: Thought content normal.        Judgment: Judgment normal.     Assessment/Plan:  Ms. Buster scheduled for excision of right foot melanoma, excision of right arm dysplasia, and application of ACell with Dr. Marla Roe.  Risks, benefits, and alternatives of procedure discussed, questions answered and consent obtained.    Smoking Status: Non-smoker; Counseling Given?  N/A  Caprini Score: 8 High; Risk Factors include: 79 year old female, melanoma, BMI > 25, and length of planned surgery. Recommendation for mechanical and pharmacological prophylaxis during surgery. Encourage early ambulation.   Pictures obtained: 11/03/2019  Post-op Rx sent to pharmacy: Tramadol, Zofran, Bactrim  Patient was provided with the General Surgical Risk consent document and Pain Medication Agreement prior to their appointment.  They had adequate time to read through the risk consent documents and Pain Medication Agreement. We also discussed them in person together during this preop appointment. All of their questions were answered to their satisfaction.  Recommended calling if they have any further questions.  Risk consent form and Pain Medication Agreement to be scanned into patient's chart.  The [place] was signed into law in 2016 which includes the topic of electronic health records.  This provides immediate access to information in MyChart.  This includes consultation notes, operative notes, office notes, lab results and pathology reports.  If you have any questions about what you read please let us know at your next visit or call us at the office.  We are right here with you.   Electronically signed by: Threasa Heads, PA-C 11/17/2019 4:44 PM

## 2019-11-17 ENCOUNTER — Other Ambulatory Visit: Payer: Self-pay

## 2019-11-17 ENCOUNTER — Other Ambulatory Visit: Payer: Medicare Other

## 2019-11-17 ENCOUNTER — Ambulatory Visit (INDEPENDENT_AMBULATORY_CARE_PROVIDER_SITE_OTHER): Payer: Medicare Other | Admitting: Plastic Surgery

## 2019-11-17 ENCOUNTER — Ambulatory Visit: Payer: Medicare Other

## 2019-11-17 ENCOUNTER — Encounter: Payer: Self-pay | Admitting: Plastic Surgery

## 2019-11-17 VITALS — BP 149/79 | HR 71 | Temp 98.2°F | Ht 63.0 in | Wt 201.0 lb

## 2019-11-17 DIAGNOSIS — C439 Malignant melanoma of skin, unspecified: Secondary | ICD-10-CM

## 2019-11-17 DIAGNOSIS — L988 Other specified disorders of the skin and subcutaneous tissue: Secondary | ICD-10-CM

## 2019-11-17 MED ORDER — ONDANSETRON HCL 4 MG PO TABS: 4.0000 mg | ORAL_TABLET | Freq: Three times a day (TID) | ORAL | 0 refills | Status: DC | PRN

## 2019-11-17 MED ORDER — TRAMADOL HCL 50 MG PO TABS: 50.0000 mg | ORAL_TABLET | Freq: Three times a day (TID) | ORAL | 0 refills | Status: AC | PRN

## 2019-11-17 MED ORDER — SULFAMETHOXAZOLE-TRIMETHOPRIM 400-80 MG PO TABS: 1.0000 | ORAL_TABLET | Freq: Two times a day (BID) | ORAL | 0 refills | Status: AC

## 2019-11-18 ENCOUNTER — Telehealth: Payer: Self-pay

## 2019-11-18 ENCOUNTER — Ambulatory Visit: Payer: Medicare Other

## 2019-11-18 ENCOUNTER — Other Ambulatory Visit: Payer: Medicare Other

## 2019-11-18 NOTE — Telephone Encounter (Signed)
Called patient and advise her [place] Night, [place] changed a few of the medications for her surgery due to her yellow dye allergy (hives).   Pain Medication is now Tramadol. She will still take it the same way I described.  Antibiotic is now Bactrim. Directions will be on the bottle for both.   Patient understood and agreed.

## 2019-11-18 NOTE — Telephone Encounter (Signed)
Patient called to state her pharmacy called to let her know that she is allergic to the Bactrim that was prescribed. She is also allergic to clindamycin. She would like another antibiotic prescribed as her surgery is tomorrow and she will need to go and pick it up today.

## 2019-11-18 NOTE — Telephone Encounter (Signed)
Called patient and advised not to pick up the Bactrim and  the ABX was only sent in for preventative care.  Patient understood and agreed.

## 2019-11-19 ENCOUNTER — Ambulatory Visit (HOSPITAL_BASED_OUTPATIENT_CLINIC_OR_DEPARTMENT_OTHER): Payer: Medicare Other | Admitting: Anesthesiology

## 2019-11-19 ENCOUNTER — Encounter (HOSPITAL_BASED_OUTPATIENT_CLINIC_OR_DEPARTMENT_OTHER)
Admission: RE | Disposition: A | Discharge status: Home / Self Care | Payer: Self-pay | Source: Home / Self Care | Attending: Plastic Surgery

## 2019-11-19 ENCOUNTER — Encounter (HOSPITAL_BASED_OUTPATIENT_CLINIC_OR_DEPARTMENT_OTHER): Payer: Self-pay | Admitting: Plastic Surgery

## 2019-11-19 ENCOUNTER — Ambulatory Visit (HOSPITAL_BASED_OUTPATIENT_CLINIC_OR_DEPARTMENT_OTHER)
Admission: RE | Admit: 2019-11-19 | Discharge: 2019-11-19 | Disposition: A | Discharge status: Home / Self Care | Payer: Medicare Other | Attending: Plastic Surgery | Admitting: Plastic Surgery

## 2019-11-19 ENCOUNTER — Other Ambulatory Visit: Payer: Self-pay

## 2019-11-19 DIAGNOSIS — F419 Anxiety disorder, unspecified: Secondary | ICD-10-CM | POA: Diagnosis not present

## 2019-11-19 DIAGNOSIS — D2361 Other benign neoplasm of skin of right upper limb, including shoulder: Secondary | ICD-10-CM | POA: Insufficient documentation

## 2019-11-19 DIAGNOSIS — D2261 Melanocytic nevi of right upper limb, including shoulder: Secondary | ICD-10-CM | POA: Diagnosis not present

## 2019-11-19 DIAGNOSIS — Z79899 Other long term (current) drug therapy: Secondary | ICD-10-CM | POA: Diagnosis not present

## 2019-11-19 DIAGNOSIS — C4371 Malignant melanoma of right lower limb, including hip: Secondary | ICD-10-CM | POA: Diagnosis not present

## 2019-11-19 DIAGNOSIS — M1990 Unspecified osteoarthritis, unspecified site: Secondary | ICD-10-CM | POA: Insufficient documentation

## 2019-11-19 DIAGNOSIS — C4361 Malignant melanoma of right upper limb, including shoulder: Secondary | ICD-10-CM | POA: Diagnosis not present

## 2019-11-19 DIAGNOSIS — C439 Malignant melanoma of skin, unspecified: Secondary | ICD-10-CM | POA: Diagnosis not present

## 2019-11-19 DIAGNOSIS — E785 Hyperlipidemia, unspecified: Secondary | ICD-10-CM | POA: Diagnosis not present

## 2019-11-19 HISTORY — DX: Other specified postprocedural states: R11.2

## 2019-11-19 HISTORY — DX: Nausea with vomiting, unspecified: R11.2

## 2019-11-19 HISTORY — DX: Nausea with vomiting, unspecified: Z98.890

## 2019-11-19 HISTORY — PX: LESION REMOVAL: SHX5196

## 2019-11-19 HISTORY — PX: NEVUS EXCISION: SHX5263

## 2019-11-19 SURGERY — EXCISION, NEVUS
Anesthesia: General | Site: Foot | Laterality: Right

## 2019-11-19 MED ORDER — HYDROMORPHONE HCL 1 MG/ML IJ SOLN: 0.2500 mg | INTRAMUSCULAR | Status: DC | PRN

## 2019-11-19 MED ORDER — PROMETHAZINE HCL 25 MG/ML IJ SOLN: 6.2500 mg | INTRAMUSCULAR | Status: DC | PRN

## 2019-11-19 MED ORDER — SODIUM CHLORIDE 0.9 % IV SOLN: 250.0000 mL | INTRAVENOUS | Status: DC | PRN

## 2019-11-19 MED ORDER — DROPERIDOL 2.5 MG/ML IJ SOLN
INTRAMUSCULAR | Status: AC
  Filled 2019-11-19: qty 2

## 2019-11-19 MED ORDER — SODIUM CHLORIDE 0.9% FLUSH: 3.0000 mL | INTRAVENOUS | Status: DC | PRN

## 2019-11-19 MED ORDER — PROPOFOL 10 MG/ML IV BOLUS
INTRAVENOUS | Status: AC
  Filled 2019-11-19: qty 20

## 2019-11-19 MED ORDER — FENTANYL CITRATE (PF) 100 MCG/2ML IJ SOLN: 25.0000 ug | INTRAMUSCULAR | Status: DC | PRN

## 2019-11-19 MED ORDER — LACTATED RINGERS IV SOLN: INTRAVENOUS | Status: DC

## 2019-11-19 MED ORDER — OXYCODONE HCL 5 MG PO TABS: 5.0000 mg | ORAL_TABLET | Freq: Once | ORAL | Status: DC | PRN

## 2019-11-19 MED ORDER — LIDOCAINE-EPINEPHRINE 1 %-1:100000 IJ SOLN
INTRAMUSCULAR | Status: DC | PRN
  Administered 2019-11-19: 30 mL

## 2019-11-19 MED ORDER — CHLORHEXIDINE GLUCONATE 4 % EX LIQD: 1.0000 "application " | Freq: Once | CUTANEOUS | Status: DC

## 2019-11-19 MED ORDER — ACETAMINOPHEN 325 MG PO TABS: 650.0000 mg | ORAL_TABLET | ORAL | Status: DC | PRN

## 2019-11-19 MED ORDER — DEXAMETHASONE SODIUM PHOSPHATE 4 MG/ML IJ SOLN
INTRAMUSCULAR | Status: DC | PRN
  Administered 2019-11-19: 10 mg via INTRAVENOUS

## 2019-11-19 MED ORDER — ONDANSETRON HCL 4 MG/2ML IJ SOLN
INTRAMUSCULAR | Status: DC | PRN
  Administered 2019-11-19: 4 mg via INTRAVENOUS

## 2019-11-19 MED ORDER — FENTANYL CITRATE (PF) 100 MCG/2ML IJ SOLN
INTRAMUSCULAR | Status: DC | PRN
Start: 2019-11-19 — End: 2019-11-19
  Administered 2019-11-19 (×2): 50 ug via INTRAVENOUS

## 2019-11-19 MED ORDER — OXYCODONE HCL 5 MG/5ML PO SOLN: 5.0000 mg | Freq: Once | ORAL | Status: DC | PRN

## 2019-11-19 MED ORDER — DROPERIDOL 2.5 MG/ML IJ SOLN
INTRAMUSCULAR | Status: DC | PRN
  Administered 2019-11-19: .625 mg via INTRAVENOUS

## 2019-11-19 MED ORDER — LIDOCAINE HCL (CARDIAC) PF 100 MG/5ML IV SOSY
PREFILLED_SYRINGE | INTRAVENOUS | Status: DC | PRN
  Administered 2019-11-19: 50 mg via INTRAVENOUS

## 2019-11-19 MED ORDER — FENTANYL CITRATE (PF) 100 MCG/2ML IJ SOLN
INTRAMUSCULAR | Status: AC
  Filled 2019-11-19: qty 2

## 2019-11-19 MED ORDER — CIPROFLOXACIN IN D5W 400 MG/200ML IV SOLN
400.0000 mg | INTRAVENOUS | Status: AC
  Administered 2019-11-19: 400 mg via INTRAVENOUS

## 2019-11-19 MED ORDER — SODIUM CHLORIDE 0.9% FLUSH: 3.0000 mL | Freq: Two times a day (BID) | INTRAVENOUS | Status: DC

## 2019-11-19 MED ORDER — PROPOFOL 10 MG/ML IV BOLUS
INTRAVENOUS | Status: DC | PRN
  Administered 2019-11-19: 200 mg via INTRAVENOUS

## 2019-11-19 MED ORDER — CIPROFLOXACIN IN D5W 400 MG/200ML IV SOLN
INTRAVENOUS | Status: AC
  Filled 2019-11-19: qty 200

## 2019-11-19 MED ORDER — ACETAMINOPHEN 325 MG RE SUPP: 650.0000 mg | RECTAL | Status: DC | PRN

## 2019-11-19 SURGICAL SUPPLY — 92 items
ADH SKN CLS APL DERMABOND .7 (GAUZE/BANDAGES/DRESSINGS) ×1
BAG DECANTER FOR FLEXI CONT (MISCELLANEOUS) ×2 IMPLANT
BLADE CLIPPER SURG (BLADE) IMPLANT
BLADE HEX COATED 2.75 (ELECTRODE) IMPLANT
BLADE SURG 10 STRL SS (BLADE) IMPLANT
BLADE SURG 15 STRL LF DISP TIS (BLADE) ×1 IMPLANT
BLADE SURG 15 STRL SS (BLADE) ×2
BNDG COHESIVE 4X5 TAN STRL (GAUZE/BANDAGES/DRESSINGS) IMPLANT
BNDG CONFORM 2 STRL LF (GAUZE/BANDAGES/DRESSINGS) IMPLANT
BNDG ELASTIC 2X5.8 VLCR STR LF (GAUZE/BANDAGES/DRESSINGS) IMPLANT
BNDG ELASTIC 3X5.8 VLCR STR LF (GAUZE/BANDAGES/DRESSINGS) IMPLANT
BNDG ELASTIC 4X5.8 VLCR STR LF (GAUZE/BANDAGES/DRESSINGS) IMPLANT
BNDG ELASTIC 6X5.8 VLCR STR LF (GAUZE/BANDAGES/DRESSINGS) IMPLANT
BNDG GAUZE ELAST 4 BULKY (GAUZE/BANDAGES/DRESSINGS) ×3 IMPLANT
CANISTER SUCT 1200ML W/VALVE (MISCELLANEOUS) IMPLANT
CORD BIPOLAR FORCEPS 12FT (ELECTRODE) IMPLANT
COVER BACK TABLE 60X90IN (DRAPES) ×2 IMPLANT
COVER MAYO STAND STRL (DRAPES) ×2 IMPLANT
COVER WAND RF STERILE (DRAPES) IMPLANT
DECANTER SPIKE VIAL GLASS SM (MISCELLANEOUS) ×2 IMPLANT
DERMABOND ADVANCED (GAUZE/BANDAGES/DRESSINGS) ×1
DERMABOND ADVANCED .7 DNX12 (GAUZE/BANDAGES/DRESSINGS) IMPLANT
DRAPE INCISE IOBAN 66X45 STRL (DRAPES) IMPLANT
DRAPE LAPAROTOMY 100X72 PEDS (DRAPES) IMPLANT
DRAPE U-SHAPE 76X120 STRL (DRAPES) ×2 IMPLANT
DRSG ADAPTIC 3X8 NADH LF (GAUZE/BANDAGES/DRESSINGS) IMPLANT
DRSG EMULSION OIL 3X3 NADH (GAUZE/BANDAGES/DRESSINGS) IMPLANT
DRSG HYDROCOLLOID 4X4 (GAUZE/BANDAGES/DRESSINGS) IMPLANT
DRSG MEPILEX BORDER 4X4 (GAUZE/BANDAGES/DRESSINGS) ×1 IMPLANT
DRSG PAD ABDOMINAL 8X10 ST (GAUZE/BANDAGES/DRESSINGS) ×1 IMPLANT
DRSG TEGADERM 2-3/8X2-3/4 SM (GAUZE/BANDAGES/DRESSINGS) IMPLANT
ELECT NDL BLADE 2-5/6 (NEEDLE) ×1 IMPLANT
ELECT NEEDLE BLADE 2-5/6 (NEEDLE) ×2 IMPLANT
ELECT REM PT RETURN 9FT ADLT (ELECTROSURGICAL) ×2
ELECT REM PT RETURN 9FT PED (ELECTROSURGICAL)
ELECTRODE REM PT RETRN 9FT PED (ELECTROSURGICAL) IMPLANT
ELECTRODE REM PT RTRN 9FT ADLT (ELECTROSURGICAL) ×1 IMPLANT
GAUZE SPONGE 4X4 12PLY STRL (GAUZE/BANDAGES/DRESSINGS) ×2 IMPLANT
GAUZE SPONGE 4X4 12PLY STRL LF (GAUZE/BANDAGES/DRESSINGS) IMPLANT
GAUZE XEROFORM 1X8 LF (GAUZE/BANDAGES/DRESSINGS) ×1 IMPLANT
GLOVE BIO SURGEON STRL SZ 6.5 (GLOVE) ×4 IMPLANT
GOWN STRL REUS W/ TWL LRG LVL3 (GOWN DISPOSABLE) ×2 IMPLANT
GOWN STRL REUS W/TWL LRG LVL3 (GOWN DISPOSABLE) ×6
NDL HYPO 25X1 1.5 SAFETY (NEEDLE) IMPLANT
NDL HYPO 30GX1 BEV (NEEDLE) ×1 IMPLANT
NDL PRECISIONGLIDE 27X1.5 (NEEDLE) IMPLANT
NEEDLE HYPO 25X1 1.5 SAFETY (NEEDLE) ×2 IMPLANT
NEEDLE HYPO 30GX1 BEV (NEEDLE) ×2 IMPLANT
NEEDLE PRECISIONGLIDE 27X1.5 (NEEDLE) IMPLANT
NS IRRIG 1000ML POUR BTL (IV SOLUTION) ×2 IMPLANT
PACK BASIN DAY SURGERY FS (CUSTOM PROCEDURE TRAY) ×2 IMPLANT
PADDING CAST ABS 3INX4YD NS (CAST SUPPLIES)
PADDING CAST ABS 4INX4YD NS (CAST SUPPLIES)
PADDING CAST ABS COTTON 3X4 (CAST SUPPLIES) IMPLANT
PADDING CAST ABS COTTON 4X4 ST (CAST SUPPLIES) IMPLANT
PENCIL SMOKE EVACUATOR (MISCELLANEOUS) ×2 IMPLANT
SHEET MEDIUM DRAPE 40X70 STRL (DRAPES) ×2 IMPLANT
SLEEVE SCD COMPRESS KNEE MED (MISCELLANEOUS) IMPLANT
SPLINT FIBERGLASS 3X35 (CAST SUPPLIES) ×2 IMPLANT
SPLINT FIBERGLASS 4X30 (CAST SUPPLIES) IMPLANT
SPONGE GAUZE 2X2 8PLY STRL LF (GAUZE/BANDAGES/DRESSINGS) IMPLANT
SPONGE LAP 18X18 RF (DISPOSABLE) ×2 IMPLANT
STAPLER VISISTAT 35W (STAPLE) ×1 IMPLANT
STOCKINETTE IMPERVIOUS LG (DRAPES) IMPLANT
STRIP CLOSURE SKIN 1/2X4 (GAUZE/BANDAGES/DRESSINGS) ×1 IMPLANT
SUCTION FRAZIER HANDLE 10FR (MISCELLANEOUS)
SUCTION TUBE FRAZIER 10FR DISP (MISCELLANEOUS) IMPLANT
SURGILUBE 2OZ TUBE FLIPTOP (MISCELLANEOUS) IMPLANT
SUT ETHILON 4 0 PS 2 18 (SUTURE) IMPLANT
SUT ETHILON 5 0 P 3 18 (SUTURE) ×2
SUT MNCRL 6-0 UNDY P1 1X18 (SUTURE) IMPLANT
SUT MNCRL AB 4-0 PS2 18 (SUTURE) ×2 IMPLANT
SUT MON AB 5-0 P3 18 (SUTURE) IMPLANT
SUT MON AB 5-0 PS2 18 (SUTURE) IMPLANT
SUT MONOCRYL 6-0 P1 1X18 (SUTURE)
SUT NYLON ETHILON 5-0 P-3 1X18 (SUTURE) IMPLANT
SUT PROLENE 5 0 P 3 (SUTURE) IMPLANT
SUT PROLENE 5 0 PS 2 (SUTURE) IMPLANT
SUT PROLENE 6 0 P 1 18 (SUTURE) IMPLANT
SUT SILK 3 0 PS 1 (SUTURE) ×2 IMPLANT
SUT VIC AB 5-0 P-3 18X BRD (SUTURE) IMPLANT
SUT VIC AB 5-0 P3 18 (SUTURE)
SUT VIC AB 5-0 PS2 18 (SUTURE) ×2 IMPLANT
SUT VICRYL 4-0 PS2 18IN ABS (SUTURE) IMPLANT
SYR BULB EAR ULCER 3OZ GRN STR (SYRINGE) IMPLANT
SYR BULB IRRIG 60ML STRL (SYRINGE) ×1 IMPLANT
SYR CONTROL 10ML LL (SYRINGE) ×2 IMPLANT
TOWEL GREEN STERILE FF (TOWEL DISPOSABLE) ×2 IMPLANT
TRAY DSU PREP LF (CUSTOM PROCEDURE TRAY) ×2 IMPLANT
TUBE CONNECTING 20X1/4 (TUBING) ×2 IMPLANT
UNDERPAD 30X36 HEAVY ABSORB (UNDERPADS AND DIAPERS) ×2 IMPLANT
YANKAUER SUCT BULB TIP NO VENT (SUCTIONS) ×2 IMPLANT

## 2019-11-19 NOTE — Interval H&P Note (Signed)
History and Physical Interval Note:  11/19/2019 10:32 AM  Courtney Lawrence  has presented today for surgery, with the diagnosis of Melanoma of skin, Dysplasia of skin.  The various methods of treatment have been discussed with the patient and family. After consideration of risks, benefits and other options for treatment, the patient has consented to  Procedure(s): Excision of right foot melanoma (Right) Excision of right arm dysplasia (Right) APPLICATION OF A-CELL (Right) as a surgical intervention.  The patient's history has been reviewed, patient examined, no change in status, stable for surgery.  I have reviewed the patient's chart and labs.  Questions were answered to the patient's satisfaction.     Courtney Lawrence

## 2019-11-19 NOTE — Transfer of Care (Signed)
Immediate Anesthesia Transfer of Care Note  Patient: Courtney Lawrence  Procedure(s) Performed: Excision of right foot melanoma (Right Foot) Excision of right arm dysplasia (Right Foot)  Patient Location: PACU  Anesthesia Type:General  Level of Consciousness: awake, alert  and drowsy  Airway & Oxygen Therapy: Patient Spontanous Breathing and Patient connected to face mask oxygen  Post-op Assessment: Report given to RN and Post -op Vital signs reviewed and stable  Post vital signs: Reviewed and stable  Last Vitals:  Vitals Value Taken Time  BP    Temp    Pulse    Resp    SpO2      Last Pain:  Vitals:   11/19/19 0940  TempSrc: Oral  PainSc: 0-No pain         Complications: No complications documented.

## 2019-11-19 NOTE — Discharge Instructions (Signed)
INSTRUCTIONS FOR AFTER SURGERY   You will likely have some questions about what to expect following your operation.  The following information will help you and your family understand what to expect when you are discharged from the hospital.  Following these guidelines will help ensure a smooth recovery and reduce risks of complications.  Postoperative instructions include information on: diet, wound care, medications and physical activity.  AFTER SURGERY Expect to go home after the procedure.  In some cases, you may need to spend one night in the hospital for observation.  DIET This surgery does not require a specific diet.  However, I have to mention that the healthier you eat the better your body can start healing. It is important to increasing your protein intake.  This means limiting the foods with added sugar.  Focus on fruits and vegetables and some meat. It is very important to drink water after your surgery.  If your urine is bright yellow, then it is concentrated, and you need to drink more water.  As a general rule after surgery, you should have 8 ounces of water every hour while awake.  If you find you are persistently nauseated or unable to take in liquids let us know.  NO TOBACCO USE or EXPOSURE.  This will slow your healing process and increase the risk of a wound.  WOUND CARE Clean with baby wipes until the dressing is removed at your first postoperative visit.   If you have steri-strips / tape directly attached to your skin leave them in place. It is OK to get these wet.  No baths, pools or hot tubs for two weeks. We close your incision to leave the smallest and best-looking scar. No ointment or creams on your incisions until given the go ahead.  Especially not Neosporin (Too many skin reactions with this one).  A few weeks after surgery you can use Mederma and start massaging the scar. No heavy lifting until cleared by the doctor.  It is OK to walk and climb stairs. Avoid excess  pressure on the right foot. Keep elevated while sitting.  You can apply ice to the operative site for the next 24 hour with the ice on for 10 minutes at a time.  DRIVING Arrange for someone to bring you home from the hospital.  No driving till dressing removed.  BOWEL MOVEMENTS Constipation can occur after anesthesia and while taking pain medication.  It is important to stay ahead for your comfort.  We recommend taking Milk of Magnesia (2 tablespoons; twice a day) while taking the pain pills.  MEDICATIONS and PAIN CONTROL At your preoperative visit for you history and physical you were given the following medications: 1. An antibiotic: Start this medication when you get home and take according to the instructions on the bottle. 2. Zofran 4 mg:  This is to treat nausea and vomiting.  You can take this every 6 hours as needed and only if needed. 3. Norco (hydrocodone/acetaminophen) 5/325 mg:  This is only to be used after you have taken the motrin or the tylenol. Every 8 hours as needed. Over the counter Medication to take: 4. Ibuprofen (Motrin) 600 mg:  Take this every 6 hours.  If you have additional pain then take 500 mg of the tylenol.  Only take the Norco after you have tried these two. 5. Miralax or stool softener of choice: Take this according to the bottle if you take the [place] Call your surgeon's office  if any of the following occur: . Fever 101 degrees F or greater . Excessive bleeding or fluid from the incision site. . Pain that increases over time without aid from the medications . Redness, warmth, or pus draining from incision sites . Persistent nausea or inability to take in liquids . Severe misshapen area that underwent the operation.   Post Anesthesia Home Care Instructions  Activity: Get plenty of rest for the remainder of the day. A responsible individual must stay with you for 24 hours following the procedure.  For the next 24 hours, DO NOT: -Drive a  car -Paediatric nurse -Drink alcoholic beverages -Take any medication unless instructed by your physician -Make any legal decisions or sign important papers.  Meals: Start with liquid foods such as gelatin or soup. Progress to regular foods as tolerated. Avoid greasy, spicy, heavy foods. If nausea and/or vomiting occur, drink only clear liquids until the nausea and/or vomiting subsides. Call your physician if vomiting continues.  Special Instructions/Symptoms: Your throat may feel dry or sore from the anesthesia or the breathing tube placed in your throat during surgery. If this causes discomfort, gargle with warm salt water. The discomfort should disappear within 24 hours.  If you had a scopolamine patch placed behind your ear for the management of post- operative nausea and/or vomiting:  1. The medication in the patch is effective for 72 hours, after which it should be removed.  Wrap patch in a tissue and discard in the trash. Wash hands thoroughly with soap and water. 2. You may remove the patch earlier than 72 hours if you experience unpleasant side effects which may include dry mouth, dizziness or visual disturbances. 3. Avoid touching the patch. Wash your hands with soap and water after contact with the patch.

## 2019-11-19 NOTE — Anesthesia Procedure Notes (Signed)
Procedure Name: LMA Insertion Date/Time: 11/19/2019 11:19 AM Performed by: Willa Frater, CRNA Pre-anesthesia Checklist: Patient identified, Emergency Drugs available, Suction available and Patient being monitored Patient Re-evaluated:Patient Re-evaluated prior to induction Oxygen Delivery Method: Circle system utilized Preoxygenation: Pre-oxygenation with 100% oxygen Induction Type: IV induction Ventilation: Mask ventilation without difficulty LMA: LMA inserted LMA Size: 4.0 Number of attempts: 1 Airway Equipment and Method: Bite block Placement Confirmation: positive ETCO2 Tube secured with: Tape Dental Injury: Teeth and Oropharynx as per pre-operative assessment

## 2019-11-19 NOTE — Op Note (Signed)
DATE OF OPERATION: 11/19/2019  LOCATION: [facility]  PREOPERATIVE DIAGNOSIS: right foot melanoma and right arm dysplastic nevus  POSTOPERATIVE DIAGNOSIS: Same  PROCEDURE:  1. Excision of right foot melanoma in situ 2.5 x 3 cm 2. Excision of right arm dysplastic nevus 1 x 1.5 cm  SURGEON: Madalin Hughart [employer], DO  ASSISTANT: Phoebe Sharps, PA  EBL: 2 cc  CONDITION: Stable  COMPLICATIONS: None  INDICATION: The patient, Courtney Lawrence, is a 79 y.o. female born on [birth year], is here for treatment of a right foot melanoma and a right arm dysplastic nevus.   PROCEDURE DETAILS:  The patient was seen prior to surgery and marked.  The IV antibiotics were given. The patient was taken to the operating room and given a general anesthetic. A standard time out was performed and all information was confirmed by those in the room. SCD was placed on the left leg.    Right arm: Local with epinephrine was injected around the perioperative site in order to help with intraoperative hemostasis and postop pain management. The lesion was marked in a 1 x 1.5 cm area was excised with the #15 blade.   Hemostasis was achieved with electrocautery.  The deep layer was closed with 4-0 Monocryl in a running subcuticular 4-0 Monocryl was used to close the skin.  Dermabond was applied.  The specimen was sent to pathology.  Right foot: Local with epinephrine was injected around the site.  A 1 cm margin was marked.  The #15 blade was used to make an excision of a 2.5 x 3 cm area.  Site undermining was done for 3 mm with the electrocautery.  Hemostasis was achieved with electrocautery as well.  A short marking stitch was placed distally at what would be the 12 o'clock position and a long stitch placed medially at what would be the 3 o'clock position.  I was able to use the 3-0 Monocryl with vertical mattress sutures to close the defect.  Xeroform and gauze was applied.  The leg was wrapped with Kerlix and  an Ace wrap.  The patient was allowed to wake up and taken to recovery room in stable condition at the end of the case. The family was notified at the end of the case.   The advanced practice practitioner (APP) assisted throughout the case.  The APP was essential in retraction and counter traction when needed to make the case progress smoothly.  This retraction and assistance made it possible to see the tissue plans for the procedure.  The assistance was needed for blood control, tissue re-approximation and assisted with closure of the incision site.

## 2019-11-19 NOTE — Anesthesia Preprocedure Evaluation (Signed)
Anesthesia Evaluation  Patient identified by MRN, date of birth, ID band Patient awake    Reviewed: Allergy & Precautions, NPO status , Patient's Chart, lab work & pertinent test results  History of Anesthesia Complications (+) PONV  Airway Mallampati: I  TM Distance: >3 FB Neck ROM: Full    Dental  (+) Teeth Intact, Dental Advisory Given   Pulmonary neg pulmonary ROS,    breath sounds clear to auscultation       Cardiovascular negative cardio ROS   Rhythm:Regular Rate:Normal     Neuro/Psych  Headaches, Anxiety negative neurological ROS  negative psych ROS   GI/Hepatic negative GI ROS, Neg liver ROS,   Endo/Other  negative endocrine ROS  Renal/GU negative Renal ROS  negative genitourinary   Musculoskeletal negative musculoskeletal ROS (+) Arthritis , Osteoarthritis,    Abdominal   Peds negative pediatric ROS (+)  Hematology negative hematology ROS (+)   Anesthesia Other Findings   Reproductive/Obstetrics negative OB ROS                             Anesthesia Physical  Anesthesia Plan  ASA: II  Anesthesia Plan: General   Post-op Pain Management:    Induction: Intravenous  PONV Risk Score and Plan: 4 or greater and Ondansetron, Dexamethasone, Midazolam, Droperidol and Treatment may vary due to age or medical condition  Airway Management Planned: LMA  Additional Equipment:   Intra-op Plan:   Post-operative Plan: Extubation in OR  Informed Consent: I have reviewed the patients History and Physical, chart, labs and discussed the procedure including the risks, benefits and alternatives for the proposed anesthesia with the patient or authorized representative who has indicated his/her understanding and acceptance.     Dental advisory given  Plan Discussed with: CRNA, Surgeon and Anesthesiologist  Anesthesia Plan Comments:         Anesthesia Quick Evaluation

## 2019-11-19 NOTE — Anesthesia Postprocedure Evaluation (Signed)
Anesthesia Post Note  Patient: Courtney Lawrence  Procedure(s) Performed: Excision of right foot melanoma (Right Foot) Excision of right arm dysplasia (Right Foot)     Patient location during evaluation: PACU Anesthesia Type: General Level of consciousness: awake and alert Pain management: pain level controlled Vital Signs Assessment: post-procedure vital signs reviewed and stable Respiratory status: spontaneous breathing, nonlabored ventilation and respiratory function stable Cardiovascular status: blood pressure returned to baseline and stable Postop Assessment: no apparent nausea or vomiting Anesthetic complications: no   No complications documented.  Last Vitals:  Vitals:   11/19/19 1245 11/19/19 1307  BP: (!) 113/54 (!) 144/61  Pulse: 88 89  Resp: 13 16  Temp: (!) 36.2 C (!) 36.4 C  SpO2: 95% 97%    Last Pain:  Vitals:   11/19/19 1307  TempSrc:   PainSc: 0-No pain                 Lynda Rainwater

## 2019-11-20 ENCOUNTER — Telehealth: Payer: Self-pay

## 2019-11-20 NOTE — Telephone Encounter (Signed)
Returned daughters call. Advised her I spoke with Dr. Marla Roe. Verified for the mother not her not to take the Bactrim due to allergies. She is able to walk/climb stairs as long as she does not put pressure on the wound area.  She may take a shower and redress her foot, or she can leave the dressings on until her post op visit. Daughter understood and agreed.

## 2019-11-20 NOTE — Telephone Encounter (Signed)
Patient's daughter, Jeral Fruit [phone]) called wanting clarification on post op instructions as she states she received different instructions on discharge than she did in her preop appointment. She also wants clarify that the antibiotic is just preventative and not necessary to take at this time since her mother is allergic.   She states that at discharge, she was told to change the dressings every other day; however, at her preop they were told not to remove until the first post op visit.   They were also told that she was not to bear weight on the foot, but the printed instructions state that she can walk around and even go upstairs.   They would like clarification on the above concerns.

## 2019-11-23 ENCOUNTER — Encounter (HOSPITAL_BASED_OUTPATIENT_CLINIC_OR_DEPARTMENT_OTHER): Payer: Self-pay | Admitting: Plastic Surgery

## 2019-11-24 LAB — SURGICAL PATHOLOGY

## 2019-11-27 NOTE — Progress Notes (Signed)
Subjective:     Patient ID: Courtney Lawrence, female    DOB: 12-19-1940, 79 y.o.   MRN: [id number]  Chief Complaint  Patient presents with  . Post-op Follow-up    HPI: The patient is a 79 y.o. female here with her daughter for follow-up after undergoing excision of a melanoma of the right plantar foot and right arm dysplasia with placement of ACell on 11/19/2019 with Dr. Marla Roe.  Pathology results (reviewed with patient): A.  Right arm: No residual dysplastic nevus.  Margins free. B.  Right foot: No residual melanoma.  Margins free.  ~ 12 Days PO Patient reports overall she is doing very well.  No issues with either incision.  Right arm incision is healing very nicely, C/D/I.  No signs of infection, redness, drainage, seroma/hematoma.  Suture knot removed today.  Right foot incision is healing nicely, C/D/I.  No signs of infection, drainage, seroma/hematoma.  Mild irritation redness present.  Patient denies fever/chills, nausea/vomiting, shortness of breath, chest pain.  Reports her right knee hurts a bit from being on the scooter.  Review of Systems  Constitutional: Negative for chills and fever.  HENT: Negative for congestion.   Respiratory: Negative for shortness of breath.   Cardiovascular: Negative for chest pain.  Gastrointestinal: Negative for constipation, diarrhea, nausea and vomiting.  Skin: Negative for pallor and rash.     Objective:   Vital Signs Pulse 84   Temp 97.8 F (36.6 C)   SpO2 97%  Vital Signs and Nursing Note Reviewed  Physical Exam Constitutional:      General: She is not in acute distress.    Appearance: Normal appearance. She is not ill-appearing.  HENT:     Head: Normocephalic and atraumatic.  Eyes:     Extraocular Movements: Extraocular movements intact.  Pulmonary:     Effort: Pulmonary effort is normal.  Musculoskeletal:        General: Normal range of motion.       Arms:     Cervical back: Normal range of motion.       Feet:      Comments: Incision healing nicely, C/D/I.  No signs of infection, redness, drainage, seroma/hematoma.  Feet:     Comments: Incision healing nicely, C/D/I.  No signs of infection, drainage, seroma/hematoma.  Mild irritation redness present. Skin:    General: Skin is warm and dry.     Coloration: Skin is not pale.     Findings: No rash.  Neurological:     Mental Status: She is alert and oriented to person, place, and time.     Gait: Gait is intact.  Psychiatric:        Mood and Affect: Mood and affect normal.        Cognition and Memory: Memory normal.        Judgment: Judgment normal.       Assessment/Plan:     ICD-10-CM   1. Melanoma of skin ([place])  C43.9   2. Dysplasia of skin  L98.8     Patient is doing well overall.  Right arm incision is healing nicely, C/D/I.  Suture knot removed today.  May apply Vaseline and bandage as desired.  Right foot incision is healing nicely, C/D/I.  No signs of infection, drainage, seroma/hematoma.  Mild irritation erythema present.  May apply Vaseline or Xeroform to incision and cover with gauze, ABD, Kerlix, Ace wrap daily.  May shower normally, avoid scrubbing the incisions.  Continue to avoid walking on the  foot until next visit.  May place foot on ground and use for transfer from sitting to standing and onto scooter.  Follow-up in 1 to 2 weeks.  Call office with any questions/concerns.   Threasa Heads, PA-C 12/01/2019, 11:43 AM

## 2019-12-01 ENCOUNTER — Ambulatory Visit (INDEPENDENT_AMBULATORY_CARE_PROVIDER_SITE_OTHER): Payer: Medicare Other | Admitting: Plastic Surgery

## 2019-12-01 ENCOUNTER — Other Ambulatory Visit: Payer: Self-pay

## 2019-12-01 ENCOUNTER — Encounter: Payer: Self-pay | Admitting: Plastic Surgery

## 2019-12-01 VITALS — HR 84 | Temp 97.8°F

## 2019-12-01 DIAGNOSIS — L988 Other specified disorders of the skin and subcutaneous tissue: Secondary | ICD-10-CM

## 2019-12-01 DIAGNOSIS — C439 Malignant melanoma of skin, unspecified: Secondary | ICD-10-CM

## 2019-12-15 DIAGNOSIS — M79674 Pain in right toe(s): Secondary | ICD-10-CM | POA: Diagnosis not present

## 2019-12-15 DIAGNOSIS — M79675 Pain in left toe(s): Secondary | ICD-10-CM | POA: Diagnosis not present

## 2019-12-15 DIAGNOSIS — B351 Tinea unguium: Secondary | ICD-10-CM | POA: Diagnosis not present

## 2019-12-18 ENCOUNTER — Other Ambulatory Visit: Payer: Self-pay

## 2019-12-18 ENCOUNTER — Ambulatory Visit (INDEPENDENT_AMBULATORY_CARE_PROVIDER_SITE_OTHER): Payer: Medicare Other | Admitting: Surgical

## 2019-12-18 ENCOUNTER — Encounter: Payer: Medicare Other | Admitting: Plastic Surgery

## 2019-12-18 ENCOUNTER — Encounter: Payer: Self-pay | Admitting: Surgical

## 2019-12-18 VITALS — BP 114/78 | HR 76 | Temp 98.7°F

## 2019-12-18 DIAGNOSIS — L988 Other specified disorders of the skin and subcutaneous tissue: Secondary | ICD-10-CM | POA: Diagnosis not present

## 2019-12-18 DIAGNOSIS — C439 Malignant melanoma of skin, unspecified: Secondary | ICD-10-CM

## 2019-12-18 NOTE — Progress Notes (Signed)
   Subjective:     Patient ID: Courtney Lawrence, female    DOB: [birth year], 79 y.o.   MRN: [id number]  Chief Complaint  Patient presents with  . Post-op Follow-up    HPI: The patient is a 79 y.o. female here for follow-up with her daughter after undergoing excision of melanoma of the right plantar foot and right arm dysplasia with placement of ACell on 11/19/2019 with Dr. Marla Roe.  Pathology results were previously reviewed with patient.  Patient has been nonambulatory on the right foot.  She has been using a wheelchair.  She feels as if she is doing well and has no complaints.  Review of Systems  Constitutional: Positive for activity change. Negative for chills and fever.  Cardiovascular: Negative for leg swelling.  Skin: Negative for wound.    Objective:   Vital Signs BP 114/78 (BP Location: Left Arm, Patient Position: Sitting, Cuff Size: Large)   Pulse 76   Temp 98.7 F (37.1 C) (Oral)   SpO2 96%  Vital Signs and Nursing Note Reviewed Physical Exam Constitutional:      Appearance: Normal appearance.  Musculoskeletal:     Right lower leg: No edema.     Left lower leg: No edema.  Skin:    General: Skin is warm.     Comments: Right plantar foot incision is well-healed, Monocryl sutures in place, no erythema, some dried skin surrounding the incision, no dehiscence noted.  No foul odor is noted.  No drainage noted.  Right arm incision is well-healed, sutures no longer present, no erythema, no drainage, no incisional dehiscence noted.   Neurological:     General: No focal deficit present.     Mental Status: She is alert.  Psychiatric:        Mood and Affect: Mood normal.        Behavior: Behavior normal.     Assessment/Plan:     ICD-10-CM   1. Melanoma of skin ([place])  C43.9   2. Dysplasia of skin  L98.8     Patient is doing well, discussed with patient that she can begin ambulating.  She was able to ambulate today in the office without any pain or discomfort.  The  incision on her right plantar foot is well-healed, however I did not remove the sutures as they are dissolvable and there is quite some tension on the incision.  Patient was comfortable with this.  I discussed with her that they would dissolve over the next few weeks.  Her right arm incision is healing well.  Recommend calling with questions or concerns. Follow up as needed   Charlies Constable, PA-C 12/18/2019, 2:18 PM

## 2019-12-25 ENCOUNTER — Other Ambulatory Visit: Payer: Self-pay

## 2019-12-25 ENCOUNTER — Ambulatory Visit
Admission: RE | Admit: 2019-12-25 | Discharge: 2019-12-25 | Disposition: A | Discharge status: Home / Self Care | Payer: Medicare Other | Source: Ambulatory Visit | Attending: Family Medicine | Admitting: Family Medicine

## 2019-12-25 DIAGNOSIS — Z1231 Encounter for screening mammogram for malignant neoplasm of breast: Secondary | ICD-10-CM | POA: Diagnosis not present

## 2019-12-28 DIAGNOSIS — H26493 Other secondary cataract, bilateral: Secondary | ICD-10-CM | POA: Diagnosis not present

## 2019-12-28 DIAGNOSIS — Z961 Presence of intraocular lens: Secondary | ICD-10-CM | POA: Diagnosis not present

## 2019-12-28 DIAGNOSIS — H02105 Unspecified ectropion of left lower eyelid: Secondary | ICD-10-CM | POA: Diagnosis not present

## 2019-12-29 DIAGNOSIS — Z23 Encounter for immunization: Secondary | ICD-10-CM | POA: Diagnosis not present

## 2019-12-30 ENCOUNTER — Ambulatory Visit: Payer: Medicare Other

## 2020-01-01 DIAGNOSIS — Z96652 Presence of left artificial knee joint: Secondary | ICD-10-CM | POA: Diagnosis not present

## 2020-01-01 DIAGNOSIS — M1711 Unilateral primary osteoarthritis, right knee: Secondary | ICD-10-CM | POA: Diagnosis not present

## 2020-01-01 DIAGNOSIS — M25562 Pain in left knee: Secondary | ICD-10-CM | POA: Diagnosis not present

## 2020-02-03 DIAGNOSIS — H02105 Unspecified ectropion of left lower eyelid: Secondary | ICD-10-CM | POA: Diagnosis not present

## 2020-02-03 DIAGNOSIS — Z961 Presence of intraocular lens: Secondary | ICD-10-CM | POA: Diagnosis not present

## 2020-02-03 DIAGNOSIS — H26492 Other secondary cataract, left eye: Secondary | ICD-10-CM | POA: Diagnosis not present

## 2020-02-16 ENCOUNTER — Other Ambulatory Visit: Payer: Self-pay

## 2020-02-16 ENCOUNTER — Ambulatory Visit
Admission: RE | Admit: 2020-02-16 | Discharge: 2020-02-16 | Disposition: A | Discharge status: Home / Self Care | Payer: Medicare Other | Source: Ambulatory Visit | Attending: Family Medicine | Admitting: Family Medicine

## 2020-02-16 DIAGNOSIS — M8580 Other specified disorders of bone density and structure, unspecified site: Secondary | ICD-10-CM

## 2020-02-16 DIAGNOSIS — M85852 Other specified disorders of bone density and structure, left thigh: Secondary | ICD-10-CM | POA: Diagnosis not present

## 2020-02-19 DIAGNOSIS — B351 Tinea unguium: Secondary | ICD-10-CM | POA: Diagnosis not present

## 2020-02-19 DIAGNOSIS — M79674 Pain in right toe(s): Secondary | ICD-10-CM | POA: Diagnosis not present

## 2020-02-19 DIAGNOSIS — M79675 Pain in left toe(s): Secondary | ICD-10-CM | POA: Diagnosis not present

## 2020-04-25 DIAGNOSIS — L821 Other seborrheic keratosis: Secondary | ICD-10-CM | POA: Diagnosis not present

## 2020-04-25 DIAGNOSIS — D1801 Hemangioma of skin and subcutaneous tissue: Secondary | ICD-10-CM | POA: Diagnosis not present

## 2020-04-25 DIAGNOSIS — L570 Actinic keratosis: Secondary | ICD-10-CM | POA: Diagnosis not present

## 2020-04-25 DIAGNOSIS — Z85820 Personal history of malignant melanoma of skin: Secondary | ICD-10-CM | POA: Diagnosis not present

## 2020-04-25 DIAGNOSIS — L814 Other melanin hyperpigmentation: Secondary | ICD-10-CM | POA: Diagnosis not present

## 2020-04-25 DIAGNOSIS — D225 Melanocytic nevi of trunk: Secondary | ICD-10-CM | POA: Diagnosis not present

## 2020-04-26 DIAGNOSIS — B351 Tinea unguium: Secondary | ICD-10-CM | POA: Diagnosis not present

## 2020-04-26 DIAGNOSIS — M79675 Pain in left toe(s): Secondary | ICD-10-CM | POA: Diagnosis not present

## 2020-04-26 DIAGNOSIS — M79674 Pain in right toe(s): Secondary | ICD-10-CM | POA: Diagnosis not present

## 2020-06-22 DIAGNOSIS — Z23 Encounter for immunization: Secondary | ICD-10-CM | POA: Diagnosis not present

## 2020-06-29 DIAGNOSIS — B351 Tinea unguium: Secondary | ICD-10-CM | POA: Diagnosis not present

## 2020-06-29 DIAGNOSIS — M79675 Pain in left toe(s): Secondary | ICD-10-CM | POA: Diagnosis not present

## 2020-06-29 DIAGNOSIS — M79674 Pain in right toe(s): Secondary | ICD-10-CM | POA: Diagnosis not present

## 2020-08-22 DIAGNOSIS — F418 Other specified anxiety disorders: Secondary | ICD-10-CM | POA: Diagnosis not present

## 2020-08-22 DIAGNOSIS — G43909 Migraine, unspecified, not intractable, without status migrainosus: Secondary | ICD-10-CM | POA: Diagnosis not present

## 2020-08-22 DIAGNOSIS — M8580 Other specified disorders of bone density and structure, unspecified site: Secondary | ICD-10-CM | POA: Diagnosis not present

## 2020-08-22 DIAGNOSIS — M150 Primary generalized (osteo)arthritis: Secondary | ICD-10-CM | POA: Diagnosis not present

## 2020-08-22 DIAGNOSIS — Z1389 Encounter for screening for other disorder: Secondary | ICD-10-CM | POA: Diagnosis not present

## 2020-08-22 DIAGNOSIS — Z0000 Encounter for general adult medical examination without abnormal findings: Secondary | ICD-10-CM | POA: Diagnosis not present

## 2020-08-22 DIAGNOSIS — E785 Hyperlipidemia, unspecified: Secondary | ICD-10-CM | POA: Diagnosis not present

## 2020-08-22 DIAGNOSIS — C439 Malignant melanoma of skin, unspecified: Secondary | ICD-10-CM | POA: Diagnosis not present

## 2020-08-22 DIAGNOSIS — H9193 Unspecified hearing loss, bilateral: Secondary | ICD-10-CM | POA: Diagnosis not present

## 2020-08-22 DIAGNOSIS — M549 Dorsalgia, unspecified: Secondary | ICD-10-CM | POA: Diagnosis not present

## 2020-08-22 DIAGNOSIS — Z800 Family history of malignant neoplasm of digestive organs: Secondary | ICD-10-CM | POA: Diagnosis not present

## 2020-08-22 DIAGNOSIS — M8588 Other specified disorders of bone density and structure, other site: Secondary | ICD-10-CM | POA: Diagnosis not present

## 2020-08-25 ENCOUNTER — Other Ambulatory Visit: Payer: Self-pay | Admitting: Family Medicine

## 2020-08-25 DIAGNOSIS — R7989 Other specified abnormal findings of blood chemistry: Secondary | ICD-10-CM

## 2020-08-31 ENCOUNTER — Encounter: Payer: Self-pay | Admitting: Family

## 2020-08-31 ENCOUNTER — Telehealth: Payer: Medicare Other | Admitting: Family

## 2020-08-31 DIAGNOSIS — U071 COVID-19: Secondary | ICD-10-CM | POA: Diagnosis not present

## 2020-08-31 MED ORDER — MOLNUPIRAVIR EUA 200MG CAPSULE: 4.0000 | ORAL_CAPSULE | Freq: Two times a day (BID) | ORAL | 0 refills | Status: AC

## 2020-08-31 NOTE — Progress Notes (Signed)
Virtual Visit Consent   Courtney Lawrence, you are scheduled for a virtual visit with a [facility] provider today.     Just as with appointments in the office, your consent must be obtained to participate.  Your consent will be active for this visit and any virtual visit you may have with one of our providers in the next 365 days.     If you have a MyChart account, a copy of this consent can be sent to you electronically.  All virtual visits are billed to your insurance company just like a traditional visit in the office.    As this is a virtual visit, video technology does not allow for your provider to perform a traditional examination.  This may limit your provider's ability to fully assess your condition.  If your provider identifies any concerns that need to be evaluated in person or the need to arrange testing (such as labs, EKG, etc.), we will make arrangements to do so.     Although advances in technology are sophisticated, we cannot ensure that it will always work on either your end or our end.  If the connection with a video visit is poor, the visit may have to be switched to a telephone visit.  With either a video or telephone visit, we are not always able to ensure that we have a secure connection.     I need to obtain your verbal consent now.   Are you willing to proceed with your visit today?    Courtney Lawrence has provided verbal consent on 08/31/2020 for a virtual visit (video or telephone).   Evelina Dun, FNP   Date: 08/31/2020 6:18 PM   Virtual Visit via Video Note   I, Evelina Dun, connected with  Courtney Lawrence  ([id number], 12-Sep-1940) on 08/31/20 at  6:15 PM EDT by a video-enabled telemedicine application and verified that I am speaking with the correct person using two identifiers.  Location: Patient: Virtual Visit Location Patient: [place] Provider: Virtual Visit Location Provider: Home   I discussed the limitations of evaluation and management by  telemedicine and the availability of in person appointments. The patient expressed understanding and agreed to proceed.    History of Present Illness: Courtney Lawrence is a 80 y.o. who identifies as a female who was assigned female at birth, and is being seen today for COVID. She reports yesterday with headache, nasal congestion that started yesterday and tested positive for COVID.   HPI: URI  This is a new problem. The current episode started yesterday. The problem has been unchanged. There has been no fever. Associated symptoms include congestion, coughing, rhinorrhea and sinus pain. Pertinent negatives include no ear pain or sneezing. She has tried increased fluids and antihistamine for the symptoms. The treatment provided mild relief.   Problems:  Patient Active Problem List   Diagnosis Date Noted   Melanoma of skin ([place]) 11/03/2019   Dysplasia of skin 11/03/2019   Personal history of colonic polyp - adenoma 10/30/2013   Family history of colon cancer in sister and father (95's) 06/18/2013   S/P left TKA 05/14/2011    Allergies:  Allergies  Allergen Reactions   Platinum-Containing Compounds     swelling   Codeine Nausea And Vomiting   Aspirin Hives and Other (See Comments)    Headache    Nickel Rash    GETS "INFECTION" WITH NICKEL EARRINGS   Penicillins     Per pt: family hx allergy  to PCN   Yellow Dyes (Non-Tartrazine) Hives   Medications:  Current Outpatient Medications:    molnupiravir EUA 200 mg CAPS, Take 4 capsules (800 mg total) by mouth 2 (two) times daily for 5 days., Disp: 40 capsule, Rfl: 0   CALCIUM-MAGNESIUM-VITAMIN D PO, Take 1 tablet by mouth 2 (two) times daily., Disp: , Rfl:    celecoxib (CELEBREX) 100 MG capsule, Take 100 mg by mouth daily., Disp: , Rfl:    Cholecalciferol (VITAMIN D-3) 5000 UNITS TABS, Take by mouth daily. , Disp: , Rfl:    Cyanocobalamin (VITAMIN B-12) 1000 MCG SUBL, Place under the tongue daily., Disp: , Rfl:    glucosamine-chondroitin  500-400 MG tablet, Take 1 tablet by mouth daily. , Disp: , Rfl:    Multiple Vitamins-Minerals (ICAPS PO), Take 1 tablet by mouth daily., Disp: , Rfl:    naratriptan (AMERGE) 2.5 MG tablet, Take 2.5 mg by mouth as needed for migraine. Take one (1) tablet at onset of headache; if returns or does not resolve, may repeat after 4 hours; do not exceed five (5) mg in 24 hours., Disp: , Rfl:    sertraline (ZOLOFT) 50 MG tablet, Take 50 mg by mouth daily., Disp: , Rfl:    simvastatin (ZOCOR) 20 MG tablet, Take 20 mg by mouth daily., Disp: , Rfl:   Observations/Objective: Patient is well-developed, well-nourished in no acute distress.  Resting comfortably  at home.  Head is normocephalic, atraumatic.  No labored breathing.  Speech is clear and coherent with logical content.  Patient is alert and oriented at baseline.  A&O X 4  Assessment and Plan: 1. COVID-19 virus detected - molnupiravir EUA 200 mg CAPS; Take 4 capsules (800 mg total) by mouth 2 (two) times daily for 5 days.  Dispense: 40 capsule; Refill: 0 COVID positive, rest, force fluids, tylenol as needed, Quarantine for at least 5 days and fever free, report any worsening symptoms such as increased shortness of breath, swelling, or continued high fevers.  Possible adverse effects discussed    Follow Up Instructions: I discussed the assessment and treatment plan with the patient. The patient was provided an opportunity to ask questions and all were answered. The patient agreed with the plan and demonstrated an understanding of the instructions.  A copy of instructions were sent to the patient via MyChart.  The patient was advised to call back or seek an in-person evaluation if the symptoms worsen or if the condition fails to improve as anticipated.  Time:  I spent 10 minutes with the patient via telehealth technology discussing the above problems/concerns.    Evelina Dun, FNP

## 2020-09-06 DIAGNOSIS — M79675 Pain in left toe(s): Secondary | ICD-10-CM | POA: Diagnosis not present

## 2020-09-06 DIAGNOSIS — M79674 Pain in right toe(s): Secondary | ICD-10-CM | POA: Diagnosis not present

## 2020-09-06 DIAGNOSIS — B351 Tinea unguium: Secondary | ICD-10-CM | POA: Diagnosis not present

## 2020-09-09 ENCOUNTER — Ambulatory Visit
Admission: RE | Admit: 2020-09-09 | Discharge: 2020-09-09 | Disposition: A | Discharge status: Home / Self Care | Payer: Medicare Other | Source: Ambulatory Visit | Attending: Family Medicine | Admitting: Family Medicine

## 2020-09-09 DIAGNOSIS — K760 Fatty (change of) liver, not elsewhere classified: Secondary | ICD-10-CM | POA: Diagnosis not present

## 2020-09-09 DIAGNOSIS — R7989 Other specified abnormal findings of blood chemistry: Secondary | ICD-10-CM

## 2020-10-06 DIAGNOSIS — R945 Abnormal results of liver function studies: Secondary | ICD-10-CM | POA: Diagnosis not present

## 2020-10-07 ENCOUNTER — Encounter: Payer: Self-pay | Admitting: Physician Assistant

## 2020-10-07 ENCOUNTER — Ambulatory Visit (INDEPENDENT_AMBULATORY_CARE_PROVIDER_SITE_OTHER): Payer: Medicare Other | Admitting: Physician Assistant

## 2020-10-07 ENCOUNTER — Other Ambulatory Visit: Payer: Self-pay

## 2020-10-07 VITALS — BP 124/76 | HR 74 | Wt 194.0 lb

## 2020-10-07 DIAGNOSIS — G43009 Migraine without aura, not intractable, without status migrainosus: Secondary | ICD-10-CM | POA: Diagnosis not present

## 2020-10-07 MED ORDER — RIZATRIPTAN BENZOATE 10 MG PO TABS: 10.0000 mg | ORAL_TABLET | ORAL | 2 refills | Status: AC | PRN

## 2020-10-07 MED ORDER — PROMETHAZINE HCL 12.5 MG PO TABS: 12.5000 mg | ORAL_TABLET | Freq: Four times a day (QID) | ORAL | 0 refills | Status: DC | PRN

## 2020-10-07 NOTE — Progress Notes (Signed)
History:  Courtney Lawrence is a 80 y.o. No obstetric history on file. who presents to clinic today for new eval of headache.  She notes her headaches have been better this last month and she is not sure why.  She has had zero headache in the prior month.  Previously she has had a very hard time with headaches.  She has thought triggers included humidity, foods.  Trigger avoidance has not eliminated her headaches.   Headache is always on the left.  When she has the headache there is a spot on the left side of her nose with throbbing.  It starts in the left temple to eye/head and then nose.  There is throbbing, worse with movement, moderate to severe, lights and noises make it worse.  It may last all day. She has been getting them since she was 41. Sample of Ubrelvy from PCP worked but after 2  long hours.  Naratriptan works within 20 minutes however she gets a rebound headache the following day. Other meds used include sumatriptan which would result in a rebound headache later the same day.  Naratriptan at least takes care of it one full day.  She also used fiorinal previously and is not sure why she discontinued.   She drinks one cup of coffee regularly but if she wakes with a headache, she may drink 2-3 cups of coffee +/- coke.      Past Medical History:  Diagnosis Date   Allergy    Anxiety    Diverticulosis 10/28/2008   DJD (degenerative joint disease)    Hyperlipidemia    Migraine    Osteopenia    Personal history of colonic polyp - adenoma 10/30/2013   PONV (postoperative nausea and vomiting)    Posterior tibial tendonitis    left   Seasonal allergies    Urticaria, chronic     Social History   Socioeconomic History   Marital status: Widowed    Spouse name: Not on file   Number of children: Not on file   Years of education: Not on file   Highest education level: Not on file  Occupational History   Occupation: president [employer]  Tobacco Use   Smoking status: Never   Smokeless  tobacco: Never  Substance and Sexual Activity   Alcohol use: Yes    Alcohol/week: 1.0 standard drink    Types: 1 Standard drinks or equivalent per week   Drug use: No   Sexual activity: Not on file  Other Topics Concern   Not on file  Social History Narrative   Married, she is retired Environmental education officer. One son two daughters Courtney Lawrence is one)   . 2-3 caffeinated drinks a day.   Social Determinants of Health   Financial Resource Strain: Not on file  Food Insecurity: Not on file  Transportation Needs: Not on file  Physical Activity: Not on file  Stress: Not on file  Social Connections: Not on file  Intimate Partner Violence: Not on file    Family History  Problem Relation Age of Onset   Colon cancer Father 45   Heart attack Father    Coronary artery disease Father    Colon cancer Sister 36   Rheum arthritis Mother    Diabetes Paternal Grandfather    Breast cancer Maternal Grandfather    Breast cancer Other    Esophageal cancer Neg Hx    Stomach cancer Neg Hx    Rectal cancer Neg Hx  Allergies  Allergen Reactions   Platinum-Containing Compounds     swelling   Codeine Nausea And Vomiting   Aspirin Hives and Other (See Comments)    Headache    Nickel Rash    GETS "INFECTION" WITH NICKEL EARRINGS   Penicillins     Per pt: family hx allergy to PCN   Yellow Dyes (Non-Tartrazine) Hives    Current Outpatient Medications on File Prior to Visit  Medication Sig Dispense Refill   CALCIUM-MAGNESIUM-VITAMIN D PO Take 1 tablet by mouth 2 (two) times daily.     celecoxib (CELEBREX) 100 MG capsule Take 100 mg by mouth daily.     Cholecalciferol (VITAMIN D-3) 5000 UNITS TABS Take by mouth daily.      Cyanocobalamin (VITAMIN B-12) 1000 MCG SUBL Place under the tongue daily.     glucosamine-chondroitin 500-400 MG tablet Take 1 tablet by mouth daily.      Multiple Vitamins-Minerals (ICAPS PO) Take 1 tablet by mouth daily.     naratriptan (AMERGE) 2.5 MG tablet Take 2.5  mg by mouth as needed for migraine. Take one (1) tablet at onset of headache; if returns or does not resolve, may repeat after 4 hours; do not exceed five (5) mg in 24 hours.     sertraline (ZOLOFT) 50 MG tablet Take 50 mg by mouth daily.     simvastatin (ZOCOR) 20 MG tablet Take 20 mg by mouth daily.     No current facility-administered medications on file prior to visit.     Review of Systems:  All pertinent positive/negative included in HPI, all other review of systems are negative   Objective:  Physical Exam BP 124/76   Pulse 74   Wt 194 lb (88 kg)   BMI 34.37 kg/m  CONSTITUTIONAL: Well-developed, well-nourished female in no acute distress.  EYES: EOM intact ENT: Normocephalic CARDIOVASCULAR: Regular rate  RESPIRATORY: Normal rate.   MUSCULOSKELETAL: Normal ROM, SKIN: Warm, dry without erythema  NEUROLOGICAL: Alert, oriented, CN II-XII grossly intact, Appropriate balance, PSYCH: Normal behavior, mood   Assessment & Plan:  Assessment:  1. Migraine without aura and without status migrainosus, not intractable    Currently stable  Plan:                           For acute migraine, can trial Rizatriptan in place of naratriptan. Sample provided of nurtec although I do not anticipate that insurance will pay for it.  I expect Nurtec would work well AND be safer from a cardiovascular standpoint.  Pt will call if migraines worsen.    Paticia Stack, PA-C 10/07/2020 11:19 AM

## 2020-10-10 ENCOUNTER — Encounter: Payer: Self-pay | Admitting: Physician Assistant

## 2020-10-10 NOTE — Patient Instructions (Signed)
Migraine Headache A migraine headache is an intense, throbbing pain on one side or both sides of the head. Migraine headaches may also cause other symptoms, such as nausea, vomiting, and sensitivity to light and noise. A migraine headache can last from 4 hours to 3 days. Talk with your doctor about what things may bring on (trigger) your migraine headaches. What are the causes? The exact cause of this condition is not known. However, a migraine may be caused when nerves in the brain become irritated and release chemicals that cause inflammation of blood vessels. This inflammation causes pain. This condition may be triggered or caused by: Drinking alcohol. Smoking. Taking medicines, such as: Medicine used to treat chest pain (nitroglycerin). Birth control pills. Estrogen. Certain blood pressure medicines. Eating or drinking products that contain nitrates, glutamate, aspartame, or tyramine. Aged cheeses, chocolate, or caffeine may also be triggers. Doing physical activity. Other things that may trigger a migraine headache include: Menstruation. Pregnancy. Hunger. Stress. Lack of sleep or too much sleep. Weather changes. Fatigue. What increases the risk? The following factors may make you more likely to experience migraine headaches: Being a certain age. This condition is more common in people who are 25-55 years old. Being female. Having a family history of migraine headaches. Being Caucasian. Having a mental health condition, such as depression or anxiety. Being obese. What are the signs or symptoms? The main symptom of this condition is pulsating or throbbing pain. This pain may: Happen in any area of the head, such as on one side or both sides. Interfere with daily activities. Get worse with physical activity. Get worse with exposure to bright lights or loud noises. Other symptoms may include: Nausea. Vomiting. Dizziness. General sensitivity to bright lights, loud noises, or  smells. Before you get a migraine headache, you may get warning signs (an aura). An aura may include: Seeing flashing lights or having blind spots. Seeing bright spots, halos, or zigzag lines. Having tunnel vision or blurred vision. Having numbness or a tingling feeling. Having trouble talking. Having muscle weakness. Some people have symptoms after a migraine headache (postdromal phase), such as: Feeling tired. Difficulty concentrating. How is this diagnosed? A migraine headache can be diagnosed based on: Your symptoms. A physical exam. Tests, such as: CT scan or an MRI of the head. These imaging tests can help rule out other causes of headaches. Taking fluid from the spine (lumbar puncture) and analyzing it (cerebrospinal fluid analysis, or CSF analysis). How is this treated? This condition may be treated with medicines that: Relieve pain. Relieve nausea. Prevent migraine headaches. Treatment for this condition may also include: Acupuncture. Lifestyle changes like avoiding foods that trigger migraine headaches. Biofeedback. Cognitive behavioral therapy. Follow these instructions at home: Medicines Take over-the-counter and prescription medicines only as told by your health care provider. Ask your health care provider if the medicine prescribed to you: Requires you to avoid driving or using heavy machinery. Can cause constipation. You may need to take these actions to prevent or treat constipation: Drink enough fluid to keep your urine pale yellow. Take over-the-counter or prescription medicines. Eat foods that are high in fiber, such as beans, whole grains, and fresh fruits and vegetables. Limit foods that are high in fat and processed sugars, such as fried or sweet foods. Lifestyle Do not drink alcohol. Do not use any products that contain nicotine or tobacco, such as cigarettes, e-cigarettes, and chewing tobacco. If you need help quitting, ask your health care  provider. Get at least 8   hours of sleep every night. Find ways to manage stress, such as meditation, deep breathing, or yoga. General instructions   Keep a journal to find out what may trigger your migraine headaches. For example, write down: What you eat and drink. How much sleep you get. Any change to your diet or medicines. If you have a migraine headache: Avoid things that make your symptoms worse, such as bright lights. It may help to lie down in a dark, quiet room. Do not drive or use heavy machinery. Ask your health care provider what activities are safe for you while you are experiencing symptoms. Keep all follow-up visits as told by your health care provider. This is important. Contact a health care provider if: You develop symptoms that are different or more severe than your usual migraine headache symptoms. You have more than 15 headache days in one month. Get help right away if: Your migraine headache becomes severe. Your migraine headache lasts longer than 72 hours. You have a fever. You have a stiff neck. You have vision loss. Your muscles feel weak or like you cannot control them. You start to lose your balance often. You have trouble walking. You faint. You have a seizure. Summary A migraine headache is an intense, throbbing pain on one side or both sides of the head. Migraines may also cause other symptoms, such as nausea, vomiting, and sensitivity to light and noise. This condition may be treated with medicines and lifestyle changes. You may also need to avoid certain things that trigger a migraine headache. Keep a journal to find out what may trigger your migraine headaches. Contact your health care provider if you have more than 15 headache days in a month or you develop symptoms that are different or more severe than your usual migraine headache symptoms. This information is not intended to replace advice given to you by your health care provider. Make sure you  discuss any questions you have with your health care provider. Document Revised: 06/13/2018 Document Reviewed: 04/03/2018 Elsevier Patient Education  2022 [employer]  

## 2020-10-26 DIAGNOSIS — D225 Melanocytic nevi of trunk: Secondary | ICD-10-CM | POA: Diagnosis not present

## 2020-10-26 DIAGNOSIS — Z85820 Personal history of malignant melanoma of skin: Secondary | ICD-10-CM | POA: Diagnosis not present

## 2020-10-26 DIAGNOSIS — L821 Other seborrheic keratosis: Secondary | ICD-10-CM | POA: Diagnosis not present

## 2020-10-26 DIAGNOSIS — L814 Other melanin hyperpigmentation: Secondary | ICD-10-CM | POA: Diagnosis not present

## 2020-10-26 DIAGNOSIS — L570 Actinic keratosis: Secondary | ICD-10-CM | POA: Diagnosis not present

## 2020-10-26 DIAGNOSIS — L820 Inflamed seborrheic keratosis: Secondary | ICD-10-CM | POA: Diagnosis not present

## 2020-11-16 DIAGNOSIS — Z23 Encounter for immunization: Secondary | ICD-10-CM | POA: Diagnosis not present

## 2020-11-21 DIAGNOSIS — M79675 Pain in left toe(s): Secondary | ICD-10-CM | POA: Diagnosis not present

## 2020-11-21 DIAGNOSIS — B351 Tinea unguium: Secondary | ICD-10-CM | POA: Diagnosis not present

## 2020-11-21 DIAGNOSIS — M79674 Pain in right toe(s): Secondary | ICD-10-CM | POA: Diagnosis not present

## 2020-11-23 ENCOUNTER — Other Ambulatory Visit (HOSPITAL_BASED_OUTPATIENT_CLINIC_OR_DEPARTMENT_OTHER): Payer: Self-pay | Admitting: Family Medicine

## 2020-11-23 DIAGNOSIS — Z1231 Encounter for screening mammogram for malignant neoplasm of breast: Secondary | ICD-10-CM

## 2020-12-26 ENCOUNTER — Other Ambulatory Visit: Payer: Self-pay

## 2020-12-26 ENCOUNTER — Encounter (HOSPITAL_BASED_OUTPATIENT_CLINIC_OR_DEPARTMENT_OTHER): Payer: Self-pay

## 2020-12-26 ENCOUNTER — Ambulatory Visit (HOSPITAL_BASED_OUTPATIENT_CLINIC_OR_DEPARTMENT_OTHER)
Admission: RE | Admit: 2020-12-26 | Discharge: 2020-12-26 | Disposition: A | Discharge status: Home / Self Care | Payer: Medicare Other | Source: Ambulatory Visit | Attending: Family Medicine | Admitting: Family Medicine

## 2020-12-26 DIAGNOSIS — Z1231 Encounter for screening mammogram for malignant neoplasm of breast: Secondary | ICD-10-CM | POA: Insufficient documentation

## 2021-01-30 DIAGNOSIS — M79675 Pain in left toe(s): Secondary | ICD-10-CM | POA: Diagnosis not present

## 2021-01-30 DIAGNOSIS — B351 Tinea unguium: Secondary | ICD-10-CM | POA: Diagnosis not present

## 2021-01-30 DIAGNOSIS — M79674 Pain in right toe(s): Secondary | ICD-10-CM | POA: Diagnosis not present

## 2021-03-17 DIAGNOSIS — R945 Abnormal results of liver function studies: Secondary | ICD-10-CM | POA: Diagnosis not present

## 2021-03-17 DIAGNOSIS — K760 Fatty (change of) liver, not elsewhere classified: Secondary | ICD-10-CM | POA: Diagnosis not present

## 2021-03-17 DIAGNOSIS — E785 Hyperlipidemia, unspecified: Secondary | ICD-10-CM | POA: Diagnosis not present

## 2021-03-17 DIAGNOSIS — F418 Other specified anxiety disorders: Secondary | ICD-10-CM | POA: Diagnosis not present

## 2021-04-10 DIAGNOSIS — M79674 Pain in right toe(s): Secondary | ICD-10-CM | POA: Diagnosis not present

## 2021-04-10 DIAGNOSIS — B351 Tinea unguium: Secondary | ICD-10-CM | POA: Diagnosis not present

## 2021-04-10 DIAGNOSIS — M79675 Pain in left toe(s): Secondary | ICD-10-CM | POA: Diagnosis not present

## 2021-05-04 DIAGNOSIS — D485 Neoplasm of uncertain behavior of skin: Secondary | ICD-10-CM | POA: Diagnosis not present

## 2021-05-04 DIAGNOSIS — D225 Melanocytic nevi of trunk: Secondary | ICD-10-CM | POA: Diagnosis not present

## 2021-05-04 DIAGNOSIS — L570 Actinic keratosis: Secondary | ICD-10-CM | POA: Diagnosis not present

## 2021-05-04 DIAGNOSIS — L905 Scar conditions and fibrosis of skin: Secondary | ICD-10-CM | POA: Diagnosis not present

## 2021-05-04 DIAGNOSIS — Z85820 Personal history of malignant melanoma of skin: Secondary | ICD-10-CM | POA: Diagnosis not present

## 2021-05-04 DIAGNOSIS — L821 Other seborrheic keratosis: Secondary | ICD-10-CM | POA: Diagnosis not present

## 2021-05-24 DIAGNOSIS — D485 Neoplasm of uncertain behavior of skin: Secondary | ICD-10-CM | POA: Diagnosis not present

## 2021-05-24 DIAGNOSIS — L988 Other specified disorders of the skin and subcutaneous tissue: Secondary | ICD-10-CM | POA: Diagnosis not present

## 2021-06-12 DIAGNOSIS — M79675 Pain in left toe(s): Secondary | ICD-10-CM | POA: Diagnosis not present

## 2021-06-12 DIAGNOSIS — B351 Tinea unguium: Secondary | ICD-10-CM | POA: Diagnosis not present

## 2021-06-12 DIAGNOSIS — M79674 Pain in right toe(s): Secondary | ICD-10-CM | POA: Diagnosis not present

## 2021-06-23 DIAGNOSIS — E785 Hyperlipidemia, unspecified: Secondary | ICD-10-CM | POA: Diagnosis not present

## 2021-08-21 DIAGNOSIS — B351 Tinea unguium: Secondary | ICD-10-CM | POA: Diagnosis not present

## 2021-08-21 DIAGNOSIS — M79675 Pain in left toe(s): Secondary | ICD-10-CM | POA: Diagnosis not present

## 2021-08-21 DIAGNOSIS — M79674 Pain in right toe(s): Secondary | ICD-10-CM | POA: Diagnosis not present

## 2021-08-23 DIAGNOSIS — Z800 Family history of malignant neoplasm of digestive organs: Secondary | ICD-10-CM | POA: Diagnosis not present

## 2021-08-23 DIAGNOSIS — G43909 Migraine, unspecified, not intractable, without status migrainosus: Secondary | ICD-10-CM | POA: Diagnosis not present

## 2021-08-23 DIAGNOSIS — Z0000 Encounter for general adult medical examination without abnormal findings: Secondary | ICD-10-CM | POA: Diagnosis not present

## 2021-08-23 DIAGNOSIS — K760 Fatty (change of) liver, not elsewhere classified: Secondary | ICD-10-CM | POA: Diagnosis not present

## 2021-08-23 DIAGNOSIS — C439 Malignant melanoma of skin, unspecified: Secondary | ICD-10-CM | POA: Diagnosis not present

## 2021-08-23 DIAGNOSIS — M8588 Other specified disorders of bone density and structure, other site: Secondary | ICD-10-CM | POA: Diagnosis not present

## 2021-08-23 DIAGNOSIS — Z1331 Encounter for screening for depression: Secondary | ICD-10-CM | POA: Diagnosis not present

## 2021-08-23 DIAGNOSIS — E785 Hyperlipidemia, unspecified: Secondary | ICD-10-CM | POA: Diagnosis not present

## 2021-08-23 DIAGNOSIS — M150 Primary generalized (osteo)arthritis: Secondary | ICD-10-CM | POA: Diagnosis not present

## 2021-08-23 DIAGNOSIS — F418 Other specified anxiety disorders: Secondary | ICD-10-CM | POA: Diagnosis not present

## 2021-10-24 DIAGNOSIS — G43909 Migraine, unspecified, not intractable, without status migrainosus: Secondary | ICD-10-CM | POA: Diagnosis not present

## 2021-10-24 DIAGNOSIS — F419 Anxiety disorder, unspecified: Secondary | ICD-10-CM | POA: Diagnosis not present

## 2021-10-30 DIAGNOSIS — M79674 Pain in right toe(s): Secondary | ICD-10-CM | POA: Diagnosis not present

## 2021-10-30 DIAGNOSIS — B351 Tinea unguium: Secondary | ICD-10-CM | POA: Diagnosis not present

## 2021-10-30 DIAGNOSIS — M79675 Pain in left toe(s): Secondary | ICD-10-CM | POA: Diagnosis not present

## 2021-11-09 DIAGNOSIS — L820 Inflamed seborrheic keratosis: Secondary | ICD-10-CM | POA: Diagnosis not present

## 2021-11-09 DIAGNOSIS — L821 Other seborrheic keratosis: Secondary | ICD-10-CM | POA: Diagnosis not present

## 2021-11-09 DIAGNOSIS — L814 Other melanin hyperpigmentation: Secondary | ICD-10-CM | POA: Diagnosis not present

## 2021-11-09 DIAGNOSIS — D1801 Hemangioma of skin and subcutaneous tissue: Secondary | ICD-10-CM | POA: Diagnosis not present

## 2021-11-09 DIAGNOSIS — Z85820 Personal history of malignant melanoma of skin: Secondary | ICD-10-CM | POA: Diagnosis not present

## 2021-11-13 ENCOUNTER — Other Ambulatory Visit (HOSPITAL_BASED_OUTPATIENT_CLINIC_OR_DEPARTMENT_OTHER): Payer: Self-pay | Admitting: Internal Medicine

## 2021-11-13 DIAGNOSIS — Z1231 Encounter for screening mammogram for malignant neoplasm of breast: Secondary | ICD-10-CM

## 2021-12-08 DIAGNOSIS — H02831 Dermatochalasis of right upper eyelid: Secondary | ICD-10-CM | POA: Diagnosis not present

## 2021-12-08 DIAGNOSIS — Z961 Presence of intraocular lens: Secondary | ICD-10-CM | POA: Diagnosis not present

## 2021-12-08 DIAGNOSIS — H02834 Dermatochalasis of left upper eyelid: Secondary | ICD-10-CM | POA: Diagnosis not present

## 2021-12-13 DIAGNOSIS — Z23 Encounter for immunization: Secondary | ICD-10-CM | POA: Diagnosis not present

## 2021-12-22 ENCOUNTER — Emergency Department (HOSPITAL_BASED_OUTPATIENT_CLINIC_OR_DEPARTMENT_OTHER)
Admission: EM | Admit: 2021-12-22 | Discharge: 2021-12-22 | Disposition: A | Discharge status: Home / Self Care | Payer: Medicare Other | Attending: Emergency Medicine | Admitting: Emergency Medicine

## 2021-12-22 ENCOUNTER — Other Ambulatory Visit: Payer: Self-pay

## 2021-12-22 ENCOUNTER — Emergency Department (HOSPITAL_BASED_OUTPATIENT_CLINIC_OR_DEPARTMENT_OTHER): Payer: Medicare Other

## 2021-12-22 ENCOUNTER — Encounter (HOSPITAL_BASED_OUTPATIENT_CLINIC_OR_DEPARTMENT_OTHER): Payer: Self-pay | Admitting: *Deleted

## 2021-12-22 DIAGNOSIS — Z96652 Presence of left artificial knee joint: Secondary | ICD-10-CM | POA: Diagnosis not present

## 2021-12-22 DIAGNOSIS — S329XXA Fracture of unspecified parts of lumbosacral spine and pelvis, initial encounter for closed fracture: Secondary | ICD-10-CM | POA: Insufficient documentation

## 2021-12-22 DIAGNOSIS — Z23 Encounter for immunization: Secondary | ICD-10-CM | POA: Diagnosis not present

## 2021-12-22 DIAGNOSIS — S0990XA Unspecified injury of head, initial encounter: Secondary | ICD-10-CM | POA: Diagnosis not present

## 2021-12-22 DIAGNOSIS — S199XXA Unspecified injury of neck, initial encounter: Secondary | ICD-10-CM | POA: Diagnosis not present

## 2021-12-22 DIAGNOSIS — Z79899 Other long term (current) drug therapy: Secondary | ICD-10-CM | POA: Diagnosis not present

## 2021-12-22 DIAGNOSIS — S3289XA Fracture of other parts of pelvis, initial encounter for closed fracture: Secondary | ICD-10-CM | POA: Diagnosis not present

## 2021-12-22 DIAGNOSIS — S0003XA Contusion of scalp, initial encounter: Secondary | ICD-10-CM | POA: Diagnosis not present

## 2021-12-22 DIAGNOSIS — M25552 Pain in left hip: Secondary | ICD-10-CM | POA: Diagnosis present

## 2021-12-22 DIAGNOSIS — S32512A Fracture of superior rim of left pubis, initial encounter for closed fracture: Secondary | ICD-10-CM | POA: Diagnosis not present

## 2021-12-22 DIAGNOSIS — S0001XA Abrasion of scalp, initial encounter: Secondary | ICD-10-CM | POA: Insufficient documentation

## 2021-12-22 DIAGNOSIS — W01190A Fall on same level from slipping, tripping and stumbling with subsequent striking against furniture, initial encounter: Secondary | ICD-10-CM | POA: Insufficient documentation

## 2021-12-22 DIAGNOSIS — W19XXXA Unspecified fall, initial encounter: Secondary | ICD-10-CM

## 2021-12-22 DIAGNOSIS — I6782 Cerebral ischemia: Secondary | ICD-10-CM | POA: Insufficient documentation

## 2021-12-22 DIAGNOSIS — S79912A Unspecified injury of left hip, initial encounter: Secondary | ICD-10-CM | POA: Diagnosis not present

## 2021-12-22 LAB — URINALYSIS, ROUTINE W REFLEX MICROSCOPIC
Bilirubin Urine: NEGATIVE
Glucose, UA: NEGATIVE mg/dL
Hgb urine dipstick: NEGATIVE
Ketones, ur: 15 mg/dL — AB
Nitrite: NEGATIVE
Protein, ur: NEGATIVE mg/dL
Specific Gravity, Urine: 1.015 (ref 1.005–1.030)
pH: 6 (ref 5.0–8.0)

## 2021-12-22 LAB — URINALYSIS, MICROSCOPIC (REFLEX)

## 2021-12-22 MED ORDER — ONDANSETRON 4 MG PO TBDP
4.0000 mg | ORAL_TABLET | Freq: Once | ORAL | Status: AC
  Administered 2021-12-22: 4 mg via ORAL
  Filled 2021-12-22: qty 1

## 2021-12-22 MED ORDER — HYDROCODONE-ACETAMINOPHEN 5-325 MG PO TABS: 1.0000 | ORAL_TABLET | Freq: Four times a day (QID) | ORAL | 0 refills | Status: DC | PRN

## 2021-12-22 MED ORDER — TETANUS-DIPHTH-ACELL PERTUSSIS 5-2.5-18.5 LF-MCG/0.5 IM SUSY
0.5000 mL | PREFILLED_SYRINGE | Freq: Once | INTRAMUSCULAR | Status: AC
  Administered 2021-12-22: 0.5 mL via INTRAMUSCULAR
  Filled 2021-12-22: qty 0.5

## 2021-12-22 MED ORDER — HYDROCODONE-ACETAMINOPHEN 5-325 MG PO TABS
1.0000 | ORAL_TABLET | Freq: Once | ORAL | Status: AC
  Administered 2021-12-22: 1 via ORAL
  Filled 2021-12-22: qty 1

## 2021-12-22 MED ORDER — ONDANSETRON 4 MG PO TBDP: 4.0000 mg | ORAL_TABLET | Freq: Three times a day (TID) | ORAL | 0 refills | Status: DC | PRN

## 2021-12-22 NOTE — ED Triage Notes (Signed)
ED MD in room, pt fell just prior to arrival to ED, denies being on any blood thinners, hit head, no bleeding noted at this time on head, no LOC, GCS 15, does have some nausea. C-spine cleared by ED MD. Denies any Shortness or breath or chest pain

## 2021-12-22 NOTE — ED Notes (Signed)
Client is HIGH FALL RISK, bracelet placed on pt, sr x 2 up, family at bedside, bed in lowest position, pt instructed not to get up wtihout staff in room

## 2021-12-22 NOTE — Discharge Instructions (Addendum)
CT scan of your head and neck showed no significant traumatic injury.  You do have broken bones in your pelvis of the superior and inferior pubic rami on the left.  This should heal without surgery.  Social worker should call you early next week to help arrange home health.  Take the pain medication as prescribed and bear weight as tolerated using a walker or other assistive device.  Follow-up with your primary doctor as well as Dr. Stann Mainland for a recheck next week.  Return to the ED with worsening pain, weakness, numbness, tingling, other concerns.

## 2021-12-22 NOTE — ED Provider Notes (Signed)
[facility] Provider Note   CSN: [id number] Arrival date & time: 12/22/21  1809     History  Chief Complaint  Patient presents with   Gaspar Skeeters SWEET JARVIS is a 81 y.o. female.  Patient here after falling.  States she was trying to take a photograph with a camera and stepped backwards losing her balance, striking her head on a table and landing on her buttocks.  She believes she stumbled while she was backing up to take a picture.  Family saw her strike her head on a wooden table.  Did not lose consciousness.  No vomiting.  But does have nausea.  She reports she was checked out by the security officer at her living facility who told her that it looked like an abrasion.  She is also complaining of pain to her left hip and groin area which is new since falling.  She was able to bear weight with some assistance.  Denies any neck or back pain.  No blood thinner use.  Only medical history is osteoarthritis status post left knee replacement. No focal weakness, numbness or tingling.  The history is provided by the patient and a relative.  Fall Associated symptoms include headaches. Pertinent negatives include no chest pain and no shortness of breath.       Home Medications Prior to Admission medications   Medication Sig Start Date End Date Taking? Authorizing Provider  CALCIUM-MAGNESIUM-VITAMIN D PO Take 1 tablet by mouth 2 (two) times daily.    [provider]  celecoxib (CELEBREX) 100 MG capsule Take 100 mg by mouth daily.    [provider]  Cholecalciferol (VITAMIN D-3) 5000 UNITS TABS Take by mouth daily.     [provider]  Cyanocobalamin (VITAMIN B-12) 1000 MCG SUBL Place under the tongue daily.    [provider]  glucosamine-chondroitin 500-400 MG tablet Take 1 tablet by mouth daily.     [provider]  Multiple Vitamins-Minerals (ICAPS PO) Take 1 tablet by mouth daily.    [provider]   naratriptan (AMERGE) 2.5 MG tablet Take 2.5 mg by mouth as needed for migraine. Take one (1) tablet at onset of headache; if returns or does not resolve, may repeat after 4 hours; do not exceed five (5) mg in 24 hours.    [provider]  promethazine (PHENERGAN) 12.5 MG tablet Take 1 tablet (12.5 mg total) by mouth every 6 (six) hours as needed for nausea or vomiting. 10/07/20   Jaclyn Prime, Collene Leyden, PA-C  rizatriptan (MAXALT) 10 MG tablet Take 1 tablet (10 mg total) by mouth as needed for migraine. May repeat in 2 hours if needed 10/07/20   Jaclyn Prime, Collene Leyden, PA-C  sertraline (ZOLOFT) 50 MG tablet Take 50 mg by mouth daily.    [provider]  simvastatin (ZOCOR) 20 MG tablet Take 20 mg by mouth daily.    [provider]      Allergies    Platinum-containing compounds, Codeine, Aspirin, Nickel, Penicillins, and Yellow dyes (non-tartrazine)    Review of Systems   Review of Systems  Constitutional:  Negative for activity change, appetite change and fever.  HENT:  Negative for congestion.   Respiratory:  Negative for cough and shortness of breath.   Cardiovascular:  Negative for chest pain.  Gastrointestinal:  Negative for nausea and vomiting.  Genitourinary:  Negative for dysuria.  Musculoskeletal:  Positive for arthralgias and myalgias. Negative for back pain.  Neurological:  Positive for headaches. Negative for weakness.   all other systems are negative except as noted in the HPI and PMH.    Physical Exam Updated Vital Signs BP (!) 161/76 (BP Location: Right Arm)   Pulse 78   Temp 97.7 F (36.5 C) (Oral)   Resp 18   SpO2 98%  Physical Exam Vitals and nursing note reviewed.  Constitutional:      General: She is not in acute distress.    Appearance: She is well-developed.  HENT:     Head: Normocephalic.     Comments: Hematoma and abrasion to left occiput.  Bleeding controlled    Mouth/Throat:     Pharynx: No oropharyngeal exudate.  Eyes:      Conjunctiva/sclera: Conjunctivae normal.     Pupils: Pupils are equal, round, and reactive to light.  Neck:     Comments: No midline C-spine tenderness. Cardiovascular:     Rate and Rhythm: Normal rate and regular rhythm.     Heart sounds: Normal heart sounds. No murmur heard. Pulmonary:     Effort: Pulmonary effort is normal. No respiratory distress.     Breath sounds: Normal breath sounds.  Chest:     Chest wall: No tenderness.  Abdominal:     Palpations: Abdomen is soft.     Tenderness: There is no abdominal tenderness. There is no guarding or rebound.  Musculoskeletal:        General: Tenderness present. Normal range of motion.     Cervical back: Normal range of motion and neck supple.     Comments: Pain with range of motion of left hip.  No shortening or external rotation.  Intact DP and PT pulse.  Skin:    General: Skin is warm.  Neurological:     Mental Status: She is alert and oriented to person, place, and time.     Cranial Nerves: No cranial nerve deficit.     Motor: No abnormal muscle tone.     Coordination: Coordination normal.     Comments:  5/5 strength throughout. CN 2-12 intact.Equal grip strength.   Psychiatric:        Behavior: Behavior normal.     ED Results / Procedures / Treatments   Labs (all labs ordered are listed, but only abnormal results are displayed) Labs Reviewed - No data to display  EKG None  Radiology CT Hip Left Wo Contrast  Result Date: 12/22/2021 CLINICAL DATA:  Trauma to the left hip. EXAM: CT OF THE LEFT HIP WITHOUT CONTRAST TECHNIQUE: Multidetector CT imaging of the left hip was performed according to the standard protocol. Multiplanar CT image reconstructions were also generated. RADIATION DOSE REDUCTION: This exam was performed according to the departmental dose-optimization program which includes automated exposure control, adjustment of the mA and/or kV according to patient size and/or use of iterative reconstruction technique.  COMPARISON:  None Available. FINDINGS: Bones/Joint/Cartilage Minimally displaced fractures of the left superior and inferior pubic rami adjacent to the symphysis pubis. No other acute fracture. There is no dislocation. No significant arthritic changes of the left hip. Ligaments Suboptimally assessed by CT. Muscles and Tendons No acute findings. Soft tissues No acute findings.  No fluid collection. IMPRESSION: Minimally displaced fractures of the left superior and inferior pubic rami. No femoral neck fracture. Electronically Signed   By: Anner Crete M.D.   On: 12/22/2021 19:34   CT Head Wo Contrast  Result Date: 12/22/2021 CLINICAL DATA:  Trauma fall EXAM: CT HEAD WITHOUT CONTRAST CT CERVICAL  SPINE WITHOUT CONTRAST TECHNIQUE: Multidetector CT imaging of the head and cervical spine was performed following the standard protocol without intravenous contrast. Multiplanar CT image reconstructions of the cervical spine were also generated. RADIATION DOSE REDUCTION: This exam was performed according to the departmental dose-optimization program which includes automated exposure control, adjustment of the mA and/or kV according to patient size and/or use of iterative reconstruction technique. COMPARISON:  None Available. FINDINGS: CT HEAD FINDINGS Brain: No acute territorial infarction, hemorrhage or intracranial mass. Mild atrophy. Mild chronic small vessel ischemic changes of the white matter. Nonenlarged ventricles Vascular: No hyperdense vessels.  Carotid vascular calcification Skull: Normal. Negative for fracture or focal lesion. Sinuses/Orbits: No acute finding. Other: Moderate left posterior parietal scalp hematoma CT CERVICAL SPINE FINDINGS Alignment: Straightening of the cervical spine. Trace anterolisthesis C4 on C5 and C6 on C7. Facet alignment within normal limits Skull base and vertebrae: No acute fracture. No primary bone lesion or focal pathologic process. Soft tissues and spinal canal: No  prevertebral fluid or swelling. No visible canal hematoma. Disc levels: Multilevel degenerative change. Advanced disc space narrowing and degenerative change C5-C6 and C6-C7. Facet degenerative changes at multiple levels with foraminal stenosis Upper chest: Negative. Other: None IMPRESSION: 1. No CT evidence for acute intracranial abnormality. Atrophy and chronic small vessel ischemic changes of the white matter 2. Straightening of the cervical spine with degenerative changes. No definite acute osseous abnormality Electronically Signed   By: Donavan Foil M.D.   On: 12/22/2021 19:19   CT Cervical Spine Wo Contrast  Result Date: 12/22/2021 CLINICAL DATA:  Trauma fall EXAM: CT HEAD WITHOUT CONTRAST CT CERVICAL SPINE WITHOUT CONTRAST TECHNIQUE: Multidetector CT imaging of the head and cervical spine was performed following the standard protocol without intravenous contrast. Multiplanar CT image reconstructions of the cervical spine were also generated. RADIATION DOSE REDUCTION: This exam was performed according to the departmental dose-optimization program which includes automated exposure control, adjustment of the mA and/or kV according to patient size and/or use of iterative reconstruction technique. COMPARISON:  None Available. FINDINGS: CT HEAD FINDINGS Brain: No acute territorial infarction, hemorrhage or intracranial mass. Mild atrophy. Mild chronic small vessel ischemic changes of the white matter. Nonenlarged ventricles Vascular: No hyperdense vessels.  Carotid vascular calcification Skull: Normal. Negative for fracture or focal lesion. Sinuses/Orbits: No acute finding. Other: Moderate left posterior parietal scalp hematoma CT CERVICAL SPINE FINDINGS Alignment: Straightening of the cervical spine. Trace anterolisthesis C4 on C5 and C6 on C7. Facet alignment within normal limits Skull base and vertebrae: No acute fracture. No primary bone lesion or focal pathologic process. Soft tissues and spinal canal:  No prevertebral fluid or swelling. No visible canal hematoma. Disc levels: Multilevel degenerative change. Advanced disc space narrowing and degenerative change C5-C6 and C6-C7. Facet degenerative changes at multiple levels with foraminal stenosis Upper chest: Negative. Other: None IMPRESSION: 1. No CT evidence for acute intracranial abnormality. Atrophy and chronic small vessel ischemic changes of the white matter 2. Straightening of the cervical spine with degenerative changes. No definite acute osseous abnormality Electronically Signed   By: Donavan Foil M.D.   On: 12/22/2021 19:19    Procedures Procedures    Medications Ordered in ED Medications  Tdap (BOOSTRIX) injection 0.5 mL (has no administration in time range)  ondansetron (ZOFRAN-ODT) disintegrating tablet 4 mg (has no administration in time range)    ED Course/ Medical Decision Making/ A&P  Medical Decision Making Amount and/or Complexity of Data Reviewed Labs: ordered. Decision-making details documented in ED Course. Radiology: ordered and independent interpretation performed. Decision-making details documented in ED Course. ECG/medicine tests: ordered and independent interpretation performed. Decision-making details documented in ED Course.  Risk Prescription drug management.  Fall with head injury.  No loss of consciousness.  No vomiting.  Complains of pain to her occiput and left hip area.  GCS is 15, ABCs are intact  CT head and C-spine are obtained given her head contusion with abrasion.    CT head negative for hemorrhage.  Results reviewed and interpreted by me.  Her contusion and abrasion does not need suturing.  Given her hip pain, imaging was obtained which shows no femoral neck fracture but does show superior and inferior pubic rami fracture with slight displacement.  Discussed with Dr. Stann Mainland of orthopedics who does agree with weightbearing as tolerated, walker and pain  control.  Patient was able to ambulate with assistance and walker.  We will give course of pain medication.  will help arrange home health.  Patient wants to go home does not want to be admitted to the hospital.  Discussed pain control, weightbearing with walker as tolerated, follow-up with her PCP as well as orthopedics next week.  Discussed return to the hospital with worsening pain, weakness, numbness, tingling, not able to ambulate.  Patient agreeable to home health which will be ordered.       Final Clinical Impression(s) / ED Diagnoses Final diagnoses:  Fall, initial encounter  Injury of head, initial encounter  Closed nondisplaced fracture of pelvis, unspecified part of pelvis, initial encounter [facility])    Rx / [place] Orders ED Discharge Orders     None         Ezequiel Essex, MD 12/22/21 2108

## 2021-12-22 NOTE — ED Notes (Addendum)
Has strong plantar and dorsal flexion bilaterally, c/o pain at left hip and left groin area, pain increases with movement by MD

## 2021-12-22 NOTE — ED Notes (Signed)
Pt ambulated well without issues. Pt educated and demonstrated understanding of using walker. Family at bedside.

## 2021-12-25 DIAGNOSIS — M25552 Pain in left hip: Secondary | ICD-10-CM | POA: Insufficient documentation

## 2021-12-26 DIAGNOSIS — M25552 Pain in left hip: Secondary | ICD-10-CM | POA: Diagnosis not present

## 2021-12-26 DIAGNOSIS — S32512A Fracture of superior rim of left pubis, initial encounter for closed fracture: Secondary | ICD-10-CM | POA: Diagnosis not present

## 2021-12-27 DIAGNOSIS — S32519A Fracture of superior rim of unspecified pubis, initial encounter for closed fracture: Secondary | ICD-10-CM | POA: Insufficient documentation

## 2021-12-29 DIAGNOSIS — S32810D Multiple fractures of pelvis with stable disruption of pelvic ring, subsequent encounter for fracture with routine healing: Secondary | ICD-10-CM | POA: Diagnosis not present

## 2021-12-29 DIAGNOSIS — W19XXXD Unspecified fall, subsequent encounter: Secondary | ICD-10-CM | POA: Diagnosis not present

## 2022-01-02 DIAGNOSIS — R2689 Other abnormalities of gait and mobility: Secondary | ICD-10-CM | POA: Diagnosis not present

## 2022-01-02 DIAGNOSIS — S3282XD Multiple fractures of pelvis without disruption of pelvic ring, subsequent encounter for fracture with routine healing: Secondary | ICD-10-CM | POA: Diagnosis not present

## 2022-01-02 DIAGNOSIS — M6281 Muscle weakness (generalized): Secondary | ICD-10-CM | POA: Diagnosis not present

## 2022-01-02 DIAGNOSIS — W19XXXD Unspecified fall, subsequent encounter: Secondary | ICD-10-CM | POA: Diagnosis not present

## 2022-01-03 ENCOUNTER — Ambulatory Visit (HOSPITAL_BASED_OUTPATIENT_CLINIC_OR_DEPARTMENT_OTHER): Payer: Medicare Other

## 2022-01-04 DIAGNOSIS — M6281 Muscle weakness (generalized): Secondary | ICD-10-CM | POA: Diagnosis not present

## 2022-01-04 DIAGNOSIS — W19XXXD Unspecified fall, subsequent encounter: Secondary | ICD-10-CM | POA: Diagnosis not present

## 2022-01-04 DIAGNOSIS — S3282XD Multiple fractures of pelvis without disruption of pelvic ring, subsequent encounter for fracture with routine healing: Secondary | ICD-10-CM | POA: Diagnosis not present

## 2022-01-04 DIAGNOSIS — R2689 Other abnormalities of gait and mobility: Secondary | ICD-10-CM | POA: Diagnosis not present

## 2022-01-08 DIAGNOSIS — W19XXXD Unspecified fall, subsequent encounter: Secondary | ICD-10-CM | POA: Diagnosis not present

## 2022-01-08 DIAGNOSIS — S3282XD Multiple fractures of pelvis without disruption of pelvic ring, subsequent encounter for fracture with routine healing: Secondary | ICD-10-CM | POA: Diagnosis not present

## 2022-01-08 DIAGNOSIS — R2689 Other abnormalities of gait and mobility: Secondary | ICD-10-CM | POA: Diagnosis not present

## 2022-01-08 DIAGNOSIS — M6281 Muscle weakness (generalized): Secondary | ICD-10-CM | POA: Diagnosis not present

## 2022-01-09 DIAGNOSIS — S32519D Fracture of superior rim of unspecified pubis, subsequent encounter for fracture with routine healing: Secondary | ICD-10-CM | POA: Diagnosis not present

## 2022-01-10 DIAGNOSIS — W19XXXD Unspecified fall, subsequent encounter: Secondary | ICD-10-CM | POA: Diagnosis not present

## 2022-01-10 DIAGNOSIS — B351 Tinea unguium: Secondary | ICD-10-CM | POA: Diagnosis not present

## 2022-01-10 DIAGNOSIS — S3282XD Multiple fractures of pelvis without disruption of pelvic ring, subsequent encounter for fracture with routine healing: Secondary | ICD-10-CM | POA: Diagnosis not present

## 2022-01-10 DIAGNOSIS — M79675 Pain in left toe(s): Secondary | ICD-10-CM | POA: Diagnosis not present

## 2022-01-10 DIAGNOSIS — R2689 Other abnormalities of gait and mobility: Secondary | ICD-10-CM | POA: Diagnosis not present

## 2022-01-10 DIAGNOSIS — M79674 Pain in right toe(s): Secondary | ICD-10-CM | POA: Diagnosis not present

## 2022-01-10 DIAGNOSIS — M6281 Muscle weakness (generalized): Secondary | ICD-10-CM | POA: Diagnosis not present

## 2022-01-11 DIAGNOSIS — W19XXXD Unspecified fall, subsequent encounter: Secondary | ICD-10-CM | POA: Diagnosis not present

## 2022-01-11 DIAGNOSIS — S3282XD Multiple fractures of pelvis without disruption of pelvic ring, subsequent encounter for fracture with routine healing: Secondary | ICD-10-CM | POA: Diagnosis not present

## 2022-01-11 DIAGNOSIS — M6281 Muscle weakness (generalized): Secondary | ICD-10-CM | POA: Diagnosis not present

## 2022-01-11 DIAGNOSIS — R2689 Other abnormalities of gait and mobility: Secondary | ICD-10-CM | POA: Diagnosis not present

## 2022-01-15 DIAGNOSIS — M6281 Muscle weakness (generalized): Secondary | ICD-10-CM | POA: Diagnosis not present

## 2022-01-15 DIAGNOSIS — S3282XD Multiple fractures of pelvis without disruption of pelvic ring, subsequent encounter for fracture with routine healing: Secondary | ICD-10-CM | POA: Diagnosis not present

## 2022-01-15 DIAGNOSIS — W19XXXD Unspecified fall, subsequent encounter: Secondary | ICD-10-CM | POA: Diagnosis not present

## 2022-01-15 DIAGNOSIS — R2689 Other abnormalities of gait and mobility: Secondary | ICD-10-CM | POA: Diagnosis not present

## 2022-01-17 DIAGNOSIS — W19XXXD Unspecified fall, subsequent encounter: Secondary | ICD-10-CM | POA: Diagnosis not present

## 2022-01-17 DIAGNOSIS — M6281 Muscle weakness (generalized): Secondary | ICD-10-CM | POA: Diagnosis not present

## 2022-01-17 DIAGNOSIS — S3282XD Multiple fractures of pelvis without disruption of pelvic ring, subsequent encounter for fracture with routine healing: Secondary | ICD-10-CM | POA: Diagnosis not present

## 2022-01-17 DIAGNOSIS — R2689 Other abnormalities of gait and mobility: Secondary | ICD-10-CM | POA: Diagnosis not present

## 2022-01-19 DIAGNOSIS — W19XXXD Unspecified fall, subsequent encounter: Secondary | ICD-10-CM | POA: Diagnosis not present

## 2022-01-19 DIAGNOSIS — R2689 Other abnormalities of gait and mobility: Secondary | ICD-10-CM | POA: Diagnosis not present

## 2022-01-19 DIAGNOSIS — S3282XD Multiple fractures of pelvis without disruption of pelvic ring, subsequent encounter for fracture with routine healing: Secondary | ICD-10-CM | POA: Diagnosis not present

## 2022-01-19 DIAGNOSIS — M6281 Muscle weakness (generalized): Secondary | ICD-10-CM | POA: Diagnosis not present

## 2022-01-22 DIAGNOSIS — R2689 Other abnormalities of gait and mobility: Secondary | ICD-10-CM | POA: Diagnosis not present

## 2022-01-22 DIAGNOSIS — M6281 Muscle weakness (generalized): Secondary | ICD-10-CM | POA: Diagnosis not present

## 2022-01-22 DIAGNOSIS — W19XXXD Unspecified fall, subsequent encounter: Secondary | ICD-10-CM | POA: Diagnosis not present

## 2022-01-22 DIAGNOSIS — S3282XD Multiple fractures of pelvis without disruption of pelvic ring, subsequent encounter for fracture with routine healing: Secondary | ICD-10-CM | POA: Diagnosis not present

## 2022-01-23 DIAGNOSIS — M6281 Muscle weakness (generalized): Secondary | ICD-10-CM | POA: Diagnosis not present

## 2022-01-23 DIAGNOSIS — S3282XD Multiple fractures of pelvis without disruption of pelvic ring, subsequent encounter for fracture with routine healing: Secondary | ICD-10-CM | POA: Diagnosis not present

## 2022-01-23 DIAGNOSIS — W19XXXD Unspecified fall, subsequent encounter: Secondary | ICD-10-CM | POA: Diagnosis not present

## 2022-01-23 DIAGNOSIS — R2689 Other abnormalities of gait and mobility: Secondary | ICD-10-CM | POA: Diagnosis not present

## 2022-01-29 DIAGNOSIS — S3282XD Multiple fractures of pelvis without disruption of pelvic ring, subsequent encounter for fracture with routine healing: Secondary | ICD-10-CM | POA: Diagnosis not present

## 2022-01-29 DIAGNOSIS — M6281 Muscle weakness (generalized): Secondary | ICD-10-CM | POA: Diagnosis not present

## 2022-01-29 DIAGNOSIS — W19XXXD Unspecified fall, subsequent encounter: Secondary | ICD-10-CM | POA: Diagnosis not present

## 2022-01-29 DIAGNOSIS — R2689 Other abnormalities of gait and mobility: Secondary | ICD-10-CM | POA: Diagnosis not present

## 2022-01-31 DIAGNOSIS — M6281 Muscle weakness (generalized): Secondary | ICD-10-CM | POA: Diagnosis not present

## 2022-01-31 DIAGNOSIS — R2689 Other abnormalities of gait and mobility: Secondary | ICD-10-CM | POA: Diagnosis not present

## 2022-01-31 DIAGNOSIS — W19XXXD Unspecified fall, subsequent encounter: Secondary | ICD-10-CM | POA: Diagnosis not present

## 2022-01-31 DIAGNOSIS — S3282XD Multiple fractures of pelvis without disruption of pelvic ring, subsequent encounter for fracture with routine healing: Secondary | ICD-10-CM | POA: Diagnosis not present

## 2022-02-01 DIAGNOSIS — R531 Weakness: Secondary | ICD-10-CM | POA: Diagnosis not present

## 2022-02-01 DIAGNOSIS — E785 Hyperlipidemia, unspecified: Secondary | ICD-10-CM | POA: Diagnosis not present

## 2022-02-02 DIAGNOSIS — M6281 Muscle weakness (generalized): Secondary | ICD-10-CM | POA: Diagnosis not present

## 2022-02-02 DIAGNOSIS — S3282XD Multiple fractures of pelvis without disruption of pelvic ring, subsequent encounter for fracture with routine healing: Secondary | ICD-10-CM | POA: Diagnosis not present

## 2022-02-02 DIAGNOSIS — W19XXXD Unspecified fall, subsequent encounter: Secondary | ICD-10-CM | POA: Diagnosis not present

## 2022-02-02 DIAGNOSIS — R2689 Other abnormalities of gait and mobility: Secondary | ICD-10-CM | POA: Diagnosis not present

## 2022-02-06 DIAGNOSIS — S3282XD Multiple fractures of pelvis without disruption of pelvic ring, subsequent encounter for fracture with routine healing: Secondary | ICD-10-CM | POA: Diagnosis not present

## 2022-02-06 DIAGNOSIS — W19XXXD Unspecified fall, subsequent encounter: Secondary | ICD-10-CM | POA: Diagnosis not present

## 2022-02-06 DIAGNOSIS — M6281 Muscle weakness (generalized): Secondary | ICD-10-CM | POA: Diagnosis not present

## 2022-02-06 DIAGNOSIS — R2689 Other abnormalities of gait and mobility: Secondary | ICD-10-CM | POA: Diagnosis not present

## 2022-02-07 DIAGNOSIS — S32519S Fracture of superior rim of unspecified pubis, sequela: Secondary | ICD-10-CM | POA: Diagnosis not present

## 2022-02-07 DIAGNOSIS — M159 Polyosteoarthritis, unspecified: Secondary | ICD-10-CM | POA: Diagnosis not present

## 2022-02-07 DIAGNOSIS — F419 Anxiety disorder, unspecified: Secondary | ICD-10-CM | POA: Diagnosis not present

## 2022-02-07 DIAGNOSIS — E782 Mixed hyperlipidemia: Secondary | ICD-10-CM | POA: Diagnosis not present

## 2022-02-08 DIAGNOSIS — M6281 Muscle weakness (generalized): Secondary | ICD-10-CM | POA: Diagnosis not present

## 2022-02-08 DIAGNOSIS — R2689 Other abnormalities of gait and mobility: Secondary | ICD-10-CM | POA: Diagnosis not present

## 2022-02-08 DIAGNOSIS — W19XXXD Unspecified fall, subsequent encounter: Secondary | ICD-10-CM | POA: Diagnosis not present

## 2022-02-08 DIAGNOSIS — S3282XD Multiple fractures of pelvis without disruption of pelvic ring, subsequent encounter for fracture with routine healing: Secondary | ICD-10-CM | POA: Diagnosis not present

## 2022-02-12 DIAGNOSIS — S3282XD Multiple fractures of pelvis without disruption of pelvic ring, subsequent encounter for fracture with routine healing: Secondary | ICD-10-CM | POA: Diagnosis not present

## 2022-02-12 DIAGNOSIS — R2689 Other abnormalities of gait and mobility: Secondary | ICD-10-CM | POA: Diagnosis not present

## 2022-02-12 DIAGNOSIS — M6281 Muscle weakness (generalized): Secondary | ICD-10-CM | POA: Diagnosis not present

## 2022-02-12 DIAGNOSIS — W19XXXD Unspecified fall, subsequent encounter: Secondary | ICD-10-CM | POA: Diagnosis not present

## 2022-02-14 DIAGNOSIS — W19XXXD Unspecified fall, subsequent encounter: Secondary | ICD-10-CM | POA: Diagnosis not present

## 2022-02-14 DIAGNOSIS — R2689 Other abnormalities of gait and mobility: Secondary | ICD-10-CM | POA: Diagnosis not present

## 2022-02-14 DIAGNOSIS — S3282XD Multiple fractures of pelvis without disruption of pelvic ring, subsequent encounter for fracture with routine healing: Secondary | ICD-10-CM | POA: Diagnosis not present

## 2022-02-14 DIAGNOSIS — M6281 Muscle weakness (generalized): Secondary | ICD-10-CM | POA: Diagnosis not present

## 2022-02-15 DIAGNOSIS — S3282XD Multiple fractures of pelvis without disruption of pelvic ring, subsequent encounter for fracture with routine healing: Secondary | ICD-10-CM | POA: Diagnosis not present

## 2022-02-15 DIAGNOSIS — R2689 Other abnormalities of gait and mobility: Secondary | ICD-10-CM | POA: Diagnosis not present

## 2022-02-15 DIAGNOSIS — W19XXXD Unspecified fall, subsequent encounter: Secondary | ICD-10-CM | POA: Diagnosis not present

## 2022-02-15 DIAGNOSIS — M6281 Muscle weakness (generalized): Secondary | ICD-10-CM | POA: Diagnosis not present

## 2022-02-20 DIAGNOSIS — W19XXXD Unspecified fall, subsequent encounter: Secondary | ICD-10-CM | POA: Diagnosis not present

## 2022-02-20 DIAGNOSIS — S3282XD Multiple fractures of pelvis without disruption of pelvic ring, subsequent encounter for fracture with routine healing: Secondary | ICD-10-CM | POA: Diagnosis not present

## 2022-02-20 DIAGNOSIS — M6281 Muscle weakness (generalized): Secondary | ICD-10-CM | POA: Diagnosis not present

## 2022-02-20 DIAGNOSIS — R2689 Other abnormalities of gait and mobility: Secondary | ICD-10-CM | POA: Diagnosis not present

## 2022-02-28 DIAGNOSIS — S3282XD Multiple fractures of pelvis without disruption of pelvic ring, subsequent encounter for fracture with routine healing: Secondary | ICD-10-CM | POA: Diagnosis not present

## 2022-02-28 DIAGNOSIS — W19XXXD Unspecified fall, subsequent encounter: Secondary | ICD-10-CM | POA: Diagnosis not present

## 2022-02-28 DIAGNOSIS — R2689 Other abnormalities of gait and mobility: Secondary | ICD-10-CM | POA: Diagnosis not present

## 2022-02-28 DIAGNOSIS — M6281 Muscle weakness (generalized): Secondary | ICD-10-CM | POA: Diagnosis not present

## 2022-03-08 DIAGNOSIS — M5136 Other intervertebral disc degeneration, lumbar region: Secondary | ICD-10-CM | POA: Diagnosis not present

## 2022-03-08 DIAGNOSIS — M25552 Pain in left hip: Secondary | ICD-10-CM | POA: Diagnosis not present

## 2022-03-08 DIAGNOSIS — M5442 Lumbago with sciatica, left side: Secondary | ICD-10-CM | POA: Diagnosis not present

## 2022-03-08 DIAGNOSIS — Z9181 History of falling: Secondary | ICD-10-CM | POA: Diagnosis not present

## 2022-03-08 DIAGNOSIS — M4316 Spondylolisthesis, lumbar region: Secondary | ICD-10-CM | POA: Diagnosis not present

## 2022-03-08 DIAGNOSIS — M545 Low back pain, unspecified: Secondary | ICD-10-CM | POA: Diagnosis not present

## 2022-03-10 DIAGNOSIS — M7062 Trochanteric bursitis, left hip: Secondary | ICD-10-CM | POA: Diagnosis not present

## 2022-03-10 DIAGNOSIS — M545 Low back pain, unspecified: Secondary | ICD-10-CM | POA: Insufficient documentation

## 2022-03-13 DIAGNOSIS — S3282XD Multiple fractures of pelvis without disruption of pelvic ring, subsequent encounter for fracture with routine healing: Secondary | ICD-10-CM | POA: Diagnosis not present

## 2022-03-13 DIAGNOSIS — M6281 Muscle weakness (generalized): Secondary | ICD-10-CM | POA: Diagnosis not present

## 2022-03-13 DIAGNOSIS — W19XXXD Unspecified fall, subsequent encounter: Secondary | ICD-10-CM | POA: Diagnosis not present

## 2022-03-13 DIAGNOSIS — R2689 Other abnormalities of gait and mobility: Secondary | ICD-10-CM | POA: Diagnosis not present

## 2022-03-14 DIAGNOSIS — W19XXXD Unspecified fall, subsequent encounter: Secondary | ICD-10-CM | POA: Diagnosis not present

## 2022-03-14 DIAGNOSIS — S3282XD Multiple fractures of pelvis without disruption of pelvic ring, subsequent encounter for fracture with routine healing: Secondary | ICD-10-CM | POA: Diagnosis not present

## 2022-03-14 DIAGNOSIS — R2689 Other abnormalities of gait and mobility: Secondary | ICD-10-CM | POA: Diagnosis not present

## 2022-03-14 DIAGNOSIS — M6281 Muscle weakness (generalized): Secondary | ICD-10-CM | POA: Diagnosis not present

## 2022-03-16 DIAGNOSIS — W19XXXD Unspecified fall, subsequent encounter: Secondary | ICD-10-CM | POA: Diagnosis not present

## 2022-03-16 DIAGNOSIS — M6281 Muscle weakness (generalized): Secondary | ICD-10-CM | POA: Diagnosis not present

## 2022-03-16 DIAGNOSIS — R2689 Other abnormalities of gait and mobility: Secondary | ICD-10-CM | POA: Diagnosis not present

## 2022-03-16 DIAGNOSIS — S3282XD Multiple fractures of pelvis without disruption of pelvic ring, subsequent encounter for fracture with routine healing: Secondary | ICD-10-CM | POA: Diagnosis not present

## 2022-03-19 DIAGNOSIS — S3282XD Multiple fractures of pelvis without disruption of pelvic ring, subsequent encounter for fracture with routine healing: Secondary | ICD-10-CM | POA: Diagnosis not present

## 2022-03-19 DIAGNOSIS — R2689 Other abnormalities of gait and mobility: Secondary | ICD-10-CM | POA: Diagnosis not present

## 2022-03-19 DIAGNOSIS — W19XXXD Unspecified fall, subsequent encounter: Secondary | ICD-10-CM | POA: Diagnosis not present

## 2022-03-19 DIAGNOSIS — M6281 Muscle weakness (generalized): Secondary | ICD-10-CM | POA: Diagnosis not present

## 2022-03-21 ENCOUNTER — Ambulatory Visit (HOSPITAL_BASED_OUTPATIENT_CLINIC_OR_DEPARTMENT_OTHER)
Admission: RE | Admit: 2022-03-21 | Discharge: 2022-03-21 | Disposition: A | Discharge status: Home / Self Care | Payer: Medicare Other | Source: Ambulatory Visit | Attending: Internal Medicine | Admitting: Internal Medicine

## 2022-03-21 ENCOUNTER — Encounter (HOSPITAL_BASED_OUTPATIENT_CLINIC_OR_DEPARTMENT_OTHER): Payer: Self-pay

## 2022-03-21 DIAGNOSIS — Z1231 Encounter for screening mammogram for malignant neoplasm of breast: Secondary | ICD-10-CM | POA: Diagnosis not present

## 2022-03-22 DIAGNOSIS — M6281 Muscle weakness (generalized): Secondary | ICD-10-CM | POA: Diagnosis not present

## 2022-03-22 DIAGNOSIS — R2689 Other abnormalities of gait and mobility: Secondary | ICD-10-CM | POA: Diagnosis not present

## 2022-03-22 DIAGNOSIS — W19XXXD Unspecified fall, subsequent encounter: Secondary | ICD-10-CM | POA: Diagnosis not present

## 2022-03-22 DIAGNOSIS — S3282XD Multiple fractures of pelvis without disruption of pelvic ring, subsequent encounter for fracture with routine healing: Secondary | ICD-10-CM | POA: Diagnosis not present

## 2022-03-26 DIAGNOSIS — S3282XD Multiple fractures of pelvis without disruption of pelvic ring, subsequent encounter for fracture with routine healing: Secondary | ICD-10-CM | POA: Diagnosis not present

## 2022-03-26 DIAGNOSIS — W19XXXD Unspecified fall, subsequent encounter: Secondary | ICD-10-CM | POA: Diagnosis not present

## 2022-03-26 DIAGNOSIS — M6281 Muscle weakness (generalized): Secondary | ICD-10-CM | POA: Diagnosis not present

## 2022-03-26 DIAGNOSIS — R2689 Other abnormalities of gait and mobility: Secondary | ICD-10-CM | POA: Diagnosis not present

## 2022-03-27 DIAGNOSIS — S3282XD Multiple fractures of pelvis without disruption of pelvic ring, subsequent encounter for fracture with routine healing: Secondary | ICD-10-CM | POA: Diagnosis not present

## 2022-03-27 DIAGNOSIS — M6281 Muscle weakness (generalized): Secondary | ICD-10-CM | POA: Diagnosis not present

## 2022-03-27 DIAGNOSIS — R2689 Other abnormalities of gait and mobility: Secondary | ICD-10-CM | POA: Diagnosis not present

## 2022-03-27 DIAGNOSIS — W19XXXD Unspecified fall, subsequent encounter: Secondary | ICD-10-CM | POA: Diagnosis not present

## 2022-03-30 DIAGNOSIS — W19XXXD Unspecified fall, subsequent encounter: Secondary | ICD-10-CM | POA: Diagnosis not present

## 2022-03-30 DIAGNOSIS — M7062 Trochanteric bursitis, left hip: Secondary | ICD-10-CM | POA: Insufficient documentation

## 2022-03-30 DIAGNOSIS — R2689 Other abnormalities of gait and mobility: Secondary | ICD-10-CM | POA: Diagnosis not present

## 2022-03-30 DIAGNOSIS — S3282XD Multiple fractures of pelvis without disruption of pelvic ring, subsequent encounter for fracture with routine healing: Secondary | ICD-10-CM | POA: Diagnosis not present

## 2022-03-30 DIAGNOSIS — M6281 Muscle weakness (generalized): Secondary | ICD-10-CM | POA: Diagnosis not present

## 2022-04-02 DIAGNOSIS — M6281 Muscle weakness (generalized): Secondary | ICD-10-CM | POA: Diagnosis not present

## 2022-04-02 DIAGNOSIS — R2689 Other abnormalities of gait and mobility: Secondary | ICD-10-CM | POA: Diagnosis not present

## 2022-04-02 DIAGNOSIS — W19XXXD Unspecified fall, subsequent encounter: Secondary | ICD-10-CM | POA: Diagnosis not present

## 2022-04-02 DIAGNOSIS — S3282XD Multiple fractures of pelvis without disruption of pelvic ring, subsequent encounter for fracture with routine healing: Secondary | ICD-10-CM | POA: Diagnosis not present

## 2022-04-04 DIAGNOSIS — W19XXXD Unspecified fall, subsequent encounter: Secondary | ICD-10-CM | POA: Diagnosis not present

## 2022-04-04 DIAGNOSIS — S3282XD Multiple fractures of pelvis without disruption of pelvic ring, subsequent encounter for fracture with routine healing: Secondary | ICD-10-CM | POA: Diagnosis not present

## 2022-04-04 DIAGNOSIS — M6281 Muscle weakness (generalized): Secondary | ICD-10-CM | POA: Diagnosis not present

## 2022-04-04 DIAGNOSIS — R2689 Other abnormalities of gait and mobility: Secondary | ICD-10-CM | POA: Diagnosis not present

## 2022-04-05 DIAGNOSIS — M7062 Trochanteric bursitis, left hip: Secondary | ICD-10-CM | POA: Diagnosis not present

## 2022-04-06 DIAGNOSIS — M6281 Muscle weakness (generalized): Secondary | ICD-10-CM | POA: Diagnosis not present

## 2022-04-06 DIAGNOSIS — W19XXXD Unspecified fall, subsequent encounter: Secondary | ICD-10-CM | POA: Diagnosis not present

## 2022-04-06 DIAGNOSIS — S3282XD Multiple fractures of pelvis without disruption of pelvic ring, subsequent encounter for fracture with routine healing: Secondary | ICD-10-CM | POA: Diagnosis not present

## 2022-04-06 DIAGNOSIS — R2689 Other abnormalities of gait and mobility: Secondary | ICD-10-CM | POA: Diagnosis not present

## 2022-04-09 DIAGNOSIS — M6281 Muscle weakness (generalized): Secondary | ICD-10-CM | POA: Diagnosis not present

## 2022-04-09 DIAGNOSIS — W19XXXD Unspecified fall, subsequent encounter: Secondary | ICD-10-CM | POA: Diagnosis not present

## 2022-04-09 DIAGNOSIS — R2689 Other abnormalities of gait and mobility: Secondary | ICD-10-CM | POA: Diagnosis not present

## 2022-04-09 DIAGNOSIS — S3282XD Multiple fractures of pelvis without disruption of pelvic ring, subsequent encounter for fracture with routine healing: Secondary | ICD-10-CM | POA: Diagnosis not present

## 2022-04-11 DIAGNOSIS — M6281 Muscle weakness (generalized): Secondary | ICD-10-CM | POA: Diagnosis not present

## 2022-04-11 DIAGNOSIS — R2689 Other abnormalities of gait and mobility: Secondary | ICD-10-CM | POA: Diagnosis not present

## 2022-04-11 DIAGNOSIS — S3282XD Multiple fractures of pelvis without disruption of pelvic ring, subsequent encounter for fracture with routine healing: Secondary | ICD-10-CM | POA: Diagnosis not present

## 2022-04-11 DIAGNOSIS — W19XXXD Unspecified fall, subsequent encounter: Secondary | ICD-10-CM | POA: Diagnosis not present

## 2022-04-12 DIAGNOSIS — S3282XD Multiple fractures of pelvis without disruption of pelvic ring, subsequent encounter for fracture with routine healing: Secondary | ICD-10-CM | POA: Diagnosis not present

## 2022-04-12 DIAGNOSIS — W19XXXD Unspecified fall, subsequent encounter: Secondary | ICD-10-CM | POA: Diagnosis not present

## 2022-04-12 DIAGNOSIS — R2689 Other abnormalities of gait and mobility: Secondary | ICD-10-CM | POA: Diagnosis not present

## 2022-04-12 DIAGNOSIS — M6281 Muscle weakness (generalized): Secondary | ICD-10-CM | POA: Diagnosis not present

## 2022-04-16 DIAGNOSIS — S3282XD Multiple fractures of pelvis without disruption of pelvic ring, subsequent encounter for fracture with routine healing: Secondary | ICD-10-CM | POA: Diagnosis not present

## 2022-04-16 DIAGNOSIS — R2689 Other abnormalities of gait and mobility: Secondary | ICD-10-CM | POA: Diagnosis not present

## 2022-04-16 DIAGNOSIS — W19XXXD Unspecified fall, subsequent encounter: Secondary | ICD-10-CM | POA: Diagnosis not present

## 2022-04-16 DIAGNOSIS — M6281 Muscle weakness (generalized): Secondary | ICD-10-CM | POA: Diagnosis not present

## 2022-04-17 DIAGNOSIS — S3282XD Multiple fractures of pelvis without disruption of pelvic ring, subsequent encounter for fracture with routine healing: Secondary | ICD-10-CM | POA: Diagnosis not present

## 2022-04-17 DIAGNOSIS — R2689 Other abnormalities of gait and mobility: Secondary | ICD-10-CM | POA: Diagnosis not present

## 2022-04-17 DIAGNOSIS — M6281 Muscle weakness (generalized): Secondary | ICD-10-CM | POA: Diagnosis not present

## 2022-04-17 DIAGNOSIS — W19XXXD Unspecified fall, subsequent encounter: Secondary | ICD-10-CM | POA: Diagnosis not present

## 2022-04-20 DIAGNOSIS — R2689 Other abnormalities of gait and mobility: Secondary | ICD-10-CM | POA: Diagnosis not present

## 2022-04-20 DIAGNOSIS — S3282XD Multiple fractures of pelvis without disruption of pelvic ring, subsequent encounter for fracture with routine healing: Secondary | ICD-10-CM | POA: Diagnosis not present

## 2022-04-20 DIAGNOSIS — M6281 Muscle weakness (generalized): Secondary | ICD-10-CM | POA: Diagnosis not present

## 2022-04-20 DIAGNOSIS — W19XXXD Unspecified fall, subsequent encounter: Secondary | ICD-10-CM | POA: Diagnosis not present

## 2022-04-23 DIAGNOSIS — W19XXXD Unspecified fall, subsequent encounter: Secondary | ICD-10-CM | POA: Diagnosis not present

## 2022-04-23 DIAGNOSIS — S3282XD Multiple fractures of pelvis without disruption of pelvic ring, subsequent encounter for fracture with routine healing: Secondary | ICD-10-CM | POA: Diagnosis not present

## 2022-04-23 DIAGNOSIS — M6281 Muscle weakness (generalized): Secondary | ICD-10-CM | POA: Diagnosis not present

## 2022-04-23 DIAGNOSIS — R2689 Other abnormalities of gait and mobility: Secondary | ICD-10-CM | POA: Diagnosis not present

## 2022-04-25 DIAGNOSIS — W19XXXD Unspecified fall, subsequent encounter: Secondary | ICD-10-CM | POA: Diagnosis not present

## 2022-04-25 DIAGNOSIS — R2689 Other abnormalities of gait and mobility: Secondary | ICD-10-CM | POA: Diagnosis not present

## 2022-04-25 DIAGNOSIS — S3282XD Multiple fractures of pelvis without disruption of pelvic ring, subsequent encounter for fracture with routine healing: Secondary | ICD-10-CM | POA: Diagnosis not present

## 2022-04-25 DIAGNOSIS — M6281 Muscle weakness (generalized): Secondary | ICD-10-CM | POA: Diagnosis not present

## 2022-05-02 DIAGNOSIS — S3282XD Multiple fractures of pelvis without disruption of pelvic ring, subsequent encounter for fracture with routine healing: Secondary | ICD-10-CM | POA: Diagnosis not present

## 2022-05-02 DIAGNOSIS — R2689 Other abnormalities of gait and mobility: Secondary | ICD-10-CM | POA: Diagnosis not present

## 2022-05-02 DIAGNOSIS — W19XXXD Unspecified fall, subsequent encounter: Secondary | ICD-10-CM | POA: Diagnosis not present

## 2022-05-02 DIAGNOSIS — M6281 Muscle weakness (generalized): Secondary | ICD-10-CM | POA: Diagnosis not present

## 2022-05-11 DIAGNOSIS — W19XXXD Unspecified fall, subsequent encounter: Secondary | ICD-10-CM | POA: Diagnosis not present

## 2022-05-11 DIAGNOSIS — S3282XD Multiple fractures of pelvis without disruption of pelvic ring, subsequent encounter for fracture with routine healing: Secondary | ICD-10-CM | POA: Diagnosis not present

## 2022-05-11 DIAGNOSIS — R2689 Other abnormalities of gait and mobility: Secondary | ICD-10-CM | POA: Diagnosis not present

## 2022-05-11 DIAGNOSIS — M6281 Muscle weakness (generalized): Secondary | ICD-10-CM | POA: Diagnosis not present

## 2022-05-14 DIAGNOSIS — R2689 Other abnormalities of gait and mobility: Secondary | ICD-10-CM | POA: Diagnosis not present

## 2022-05-14 DIAGNOSIS — W19XXXD Unspecified fall, subsequent encounter: Secondary | ICD-10-CM | POA: Diagnosis not present

## 2022-05-14 DIAGNOSIS — M6281 Muscle weakness (generalized): Secondary | ICD-10-CM | POA: Diagnosis not present

## 2022-05-14 DIAGNOSIS — S3282XD Multiple fractures of pelvis without disruption of pelvic ring, subsequent encounter for fracture with routine healing: Secondary | ICD-10-CM | POA: Diagnosis not present

## 2022-05-16 DIAGNOSIS — S3282XD Multiple fractures of pelvis without disruption of pelvic ring, subsequent encounter for fracture with routine healing: Secondary | ICD-10-CM | POA: Diagnosis not present

## 2022-05-16 DIAGNOSIS — D1801 Hemangioma of skin and subcutaneous tissue: Secondary | ICD-10-CM | POA: Diagnosis not present

## 2022-05-16 DIAGNOSIS — M6281 Muscle weakness (generalized): Secondary | ICD-10-CM | POA: Diagnosis not present

## 2022-05-16 DIAGNOSIS — L820 Inflamed seborrheic keratosis: Secondary | ICD-10-CM | POA: Diagnosis not present

## 2022-05-16 DIAGNOSIS — D2271 Melanocytic nevi of right lower limb, including hip: Secondary | ICD-10-CM | POA: Diagnosis not present

## 2022-05-16 DIAGNOSIS — L821 Other seborrheic keratosis: Secondary | ICD-10-CM | POA: Diagnosis not present

## 2022-05-16 DIAGNOSIS — R2689 Other abnormalities of gait and mobility: Secondary | ICD-10-CM | POA: Diagnosis not present

## 2022-05-16 DIAGNOSIS — L570 Actinic keratosis: Secondary | ICD-10-CM | POA: Diagnosis not present

## 2022-05-16 DIAGNOSIS — W19XXXD Unspecified fall, subsequent encounter: Secondary | ICD-10-CM | POA: Diagnosis not present

## 2022-05-16 DIAGNOSIS — Z85820 Personal history of malignant melanoma of skin: Secondary | ICD-10-CM | POA: Diagnosis not present

## 2022-05-16 DIAGNOSIS — L814 Other melanin hyperpigmentation: Secondary | ICD-10-CM | POA: Diagnosis not present

## 2022-05-18 DIAGNOSIS — S3282XD Multiple fractures of pelvis without disruption of pelvic ring, subsequent encounter for fracture with routine healing: Secondary | ICD-10-CM | POA: Diagnosis not present

## 2022-05-18 DIAGNOSIS — R2689 Other abnormalities of gait and mobility: Secondary | ICD-10-CM | POA: Diagnosis not present

## 2022-05-18 DIAGNOSIS — M6281 Muscle weakness (generalized): Secondary | ICD-10-CM | POA: Diagnosis not present

## 2022-05-18 DIAGNOSIS — W19XXXD Unspecified fall, subsequent encounter: Secondary | ICD-10-CM | POA: Diagnosis not present

## 2022-05-21 DIAGNOSIS — W19XXXD Unspecified fall, subsequent encounter: Secondary | ICD-10-CM | POA: Diagnosis not present

## 2022-05-21 DIAGNOSIS — M6281 Muscle weakness (generalized): Secondary | ICD-10-CM | POA: Diagnosis not present

## 2022-05-21 DIAGNOSIS — R2689 Other abnormalities of gait and mobility: Secondary | ICD-10-CM | POA: Diagnosis not present

## 2022-05-21 DIAGNOSIS — S3282XD Multiple fractures of pelvis without disruption of pelvic ring, subsequent encounter for fracture with routine healing: Secondary | ICD-10-CM | POA: Diagnosis not present

## 2022-05-22 DIAGNOSIS — R2689 Other abnormalities of gait and mobility: Secondary | ICD-10-CM | POA: Diagnosis not present

## 2022-05-22 DIAGNOSIS — W19XXXD Unspecified fall, subsequent encounter: Secondary | ICD-10-CM | POA: Diagnosis not present

## 2022-05-22 DIAGNOSIS — S3282XD Multiple fractures of pelvis without disruption of pelvic ring, subsequent encounter for fracture with routine healing: Secondary | ICD-10-CM | POA: Diagnosis not present

## 2022-05-22 DIAGNOSIS — M6281 Muscle weakness (generalized): Secondary | ICD-10-CM | POA: Diagnosis not present

## 2022-05-25 DIAGNOSIS — W19XXXD Unspecified fall, subsequent encounter: Secondary | ICD-10-CM | POA: Diagnosis not present

## 2022-05-25 DIAGNOSIS — S3282XD Multiple fractures of pelvis without disruption of pelvic ring, subsequent encounter for fracture with routine healing: Secondary | ICD-10-CM | POA: Diagnosis not present

## 2022-05-25 DIAGNOSIS — R2689 Other abnormalities of gait and mobility: Secondary | ICD-10-CM | POA: Diagnosis not present

## 2022-05-25 DIAGNOSIS — M6281 Muscle weakness (generalized): Secondary | ICD-10-CM | POA: Diagnosis not present

## 2022-05-30 DIAGNOSIS — M159 Polyosteoarthritis, unspecified: Secondary | ICD-10-CM | POA: Diagnosis not present

## 2022-05-30 DIAGNOSIS — R5383 Other fatigue: Secondary | ICD-10-CM | POA: Insufficient documentation

## 2022-05-30 DIAGNOSIS — M7918 Myalgia, other site: Secondary | ICD-10-CM | POA: Insufficient documentation

## 2022-05-30 DIAGNOSIS — R351 Nocturia: Secondary | ICD-10-CM | POA: Insufficient documentation

## 2022-05-31 DIAGNOSIS — E538 Deficiency of other specified B group vitamins: Secondary | ICD-10-CM | POA: Diagnosis not present

## 2022-05-31 DIAGNOSIS — R531 Weakness: Secondary | ICD-10-CM | POA: Diagnosis not present

## 2022-05-31 DIAGNOSIS — E039 Hypothyroidism, unspecified: Secondary | ICD-10-CM | POA: Diagnosis not present

## 2022-05-31 DIAGNOSIS — I10 Essential (primary) hypertension: Secondary | ICD-10-CM | POA: Diagnosis not present

## 2022-06-07 DIAGNOSIS — M25551 Pain in right hip: Secondary | ICD-10-CM | POA: Diagnosis not present

## 2022-06-14 DIAGNOSIS — B351 Tinea unguium: Secondary | ICD-10-CM | POA: Diagnosis not present

## 2022-06-14 DIAGNOSIS — M79674 Pain in right toe(s): Secondary | ICD-10-CM | POA: Diagnosis not present

## 2022-06-14 DIAGNOSIS — M79675 Pain in left toe(s): Secondary | ICD-10-CM | POA: Diagnosis not present

## 2022-07-06 DIAGNOSIS — M7918 Myalgia, other site: Secondary | ICD-10-CM | POA: Diagnosis not present

## 2022-07-06 DIAGNOSIS — M25551 Pain in right hip: Secondary | ICD-10-CM | POA: Diagnosis not present

## 2022-07-06 DIAGNOSIS — M4180 Other forms of scoliosis, site unspecified: Secondary | ICD-10-CM | POA: Diagnosis not present

## 2022-07-06 DIAGNOSIS — M4316 Spondylolisthesis, lumbar region: Secondary | ICD-10-CM | POA: Diagnosis not present

## 2022-07-22 DIAGNOSIS — M4180 Other forms of scoliosis, site unspecified: Secondary | ICD-10-CM | POA: Insufficient documentation

## 2022-08-03 DIAGNOSIS — M533 Sacrococcygeal disorders, not elsewhere classified: Secondary | ICD-10-CM | POA: Insufficient documentation

## 2022-08-03 DIAGNOSIS — M47816 Spondylosis without myelopathy or radiculopathy, lumbar region: Secondary | ICD-10-CM | POA: Diagnosis not present

## 2022-08-06 IMAGING — MG DIGITAL SCREENING BILAT W/ TOMO W/ CAD
8 series · 8 of 24 positions shown · non-contrast
Comparison: Previous exam(s).

ACR Breast Density Category a: The breast tissue is almost entirely
fatty.

CLINICAL DATA: Screening.

EXAM:
DIGITAL SCREENING BILATERAL MAMMOGRAM WITH TOMO AND CAD

[R CC synth-2D]
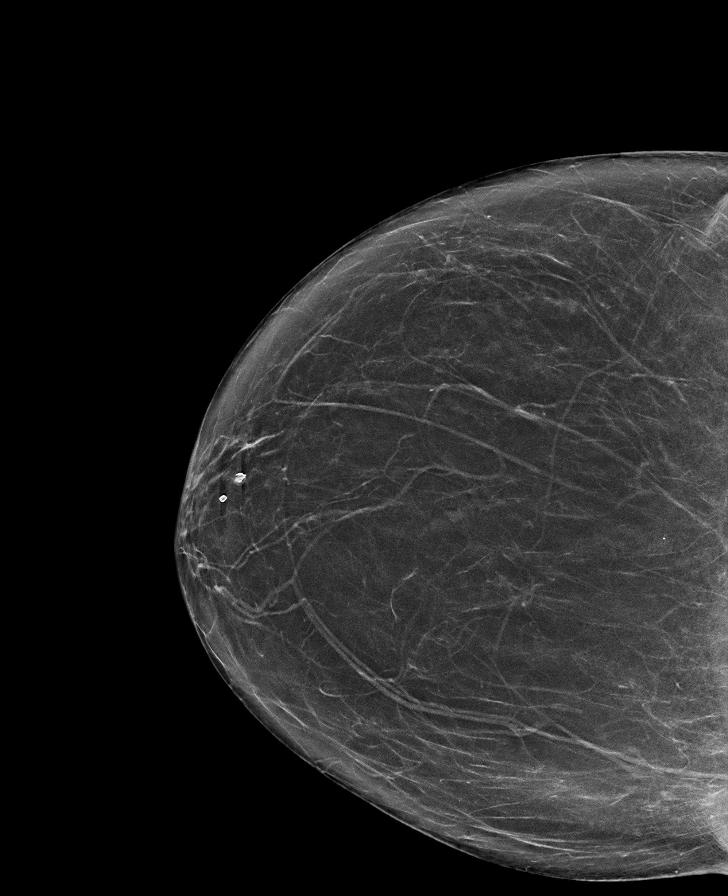

[R MLO synth-2D]
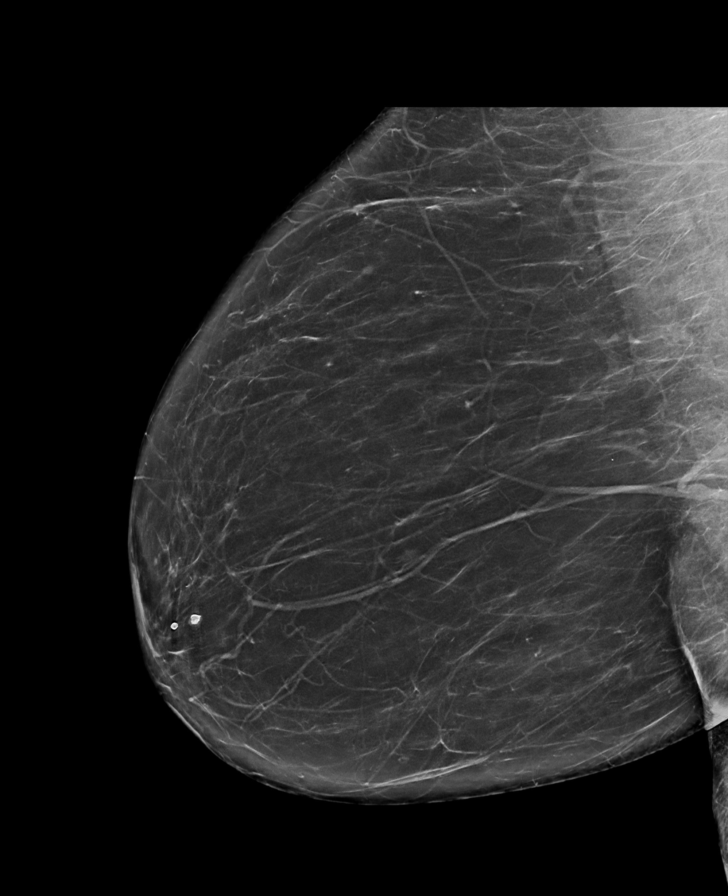

[L CC synth-2D]
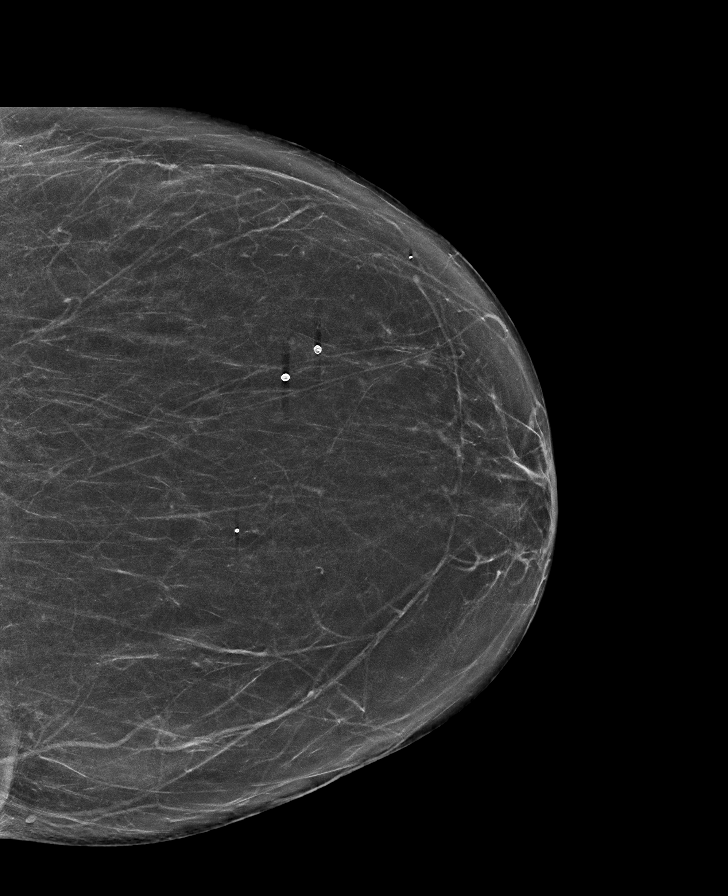

[L MLO synth-2D]
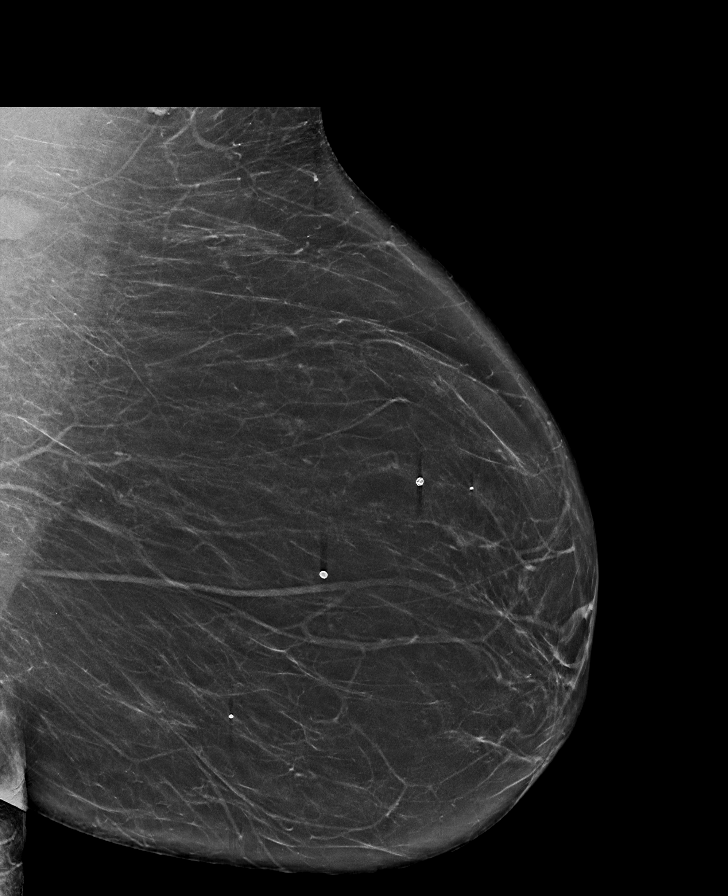

[R MLO tomo · tomo slice 41/80.0]
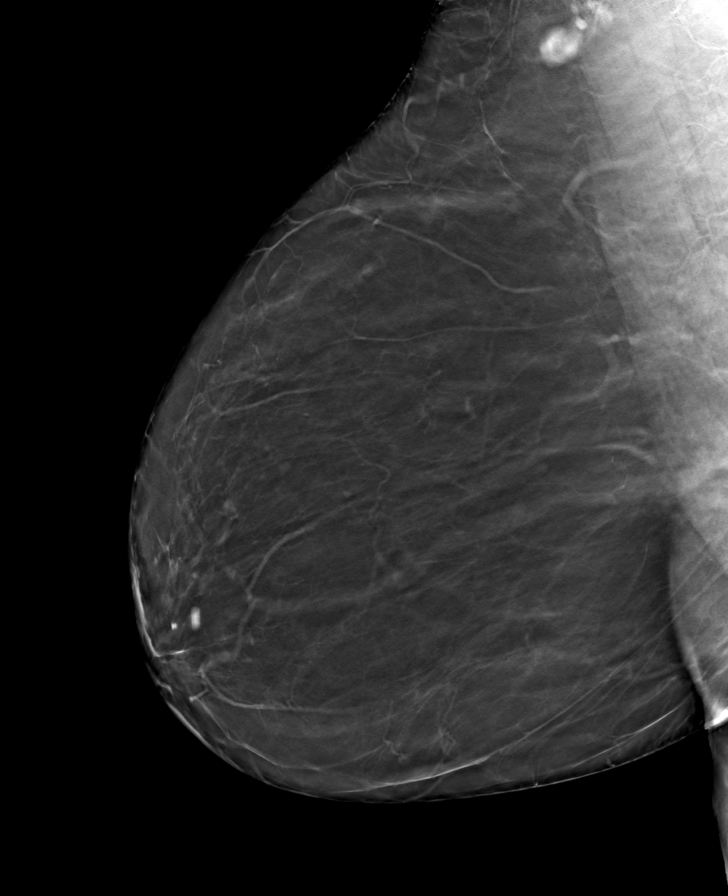

[L CC tomo · tomo slice 36/71.0]
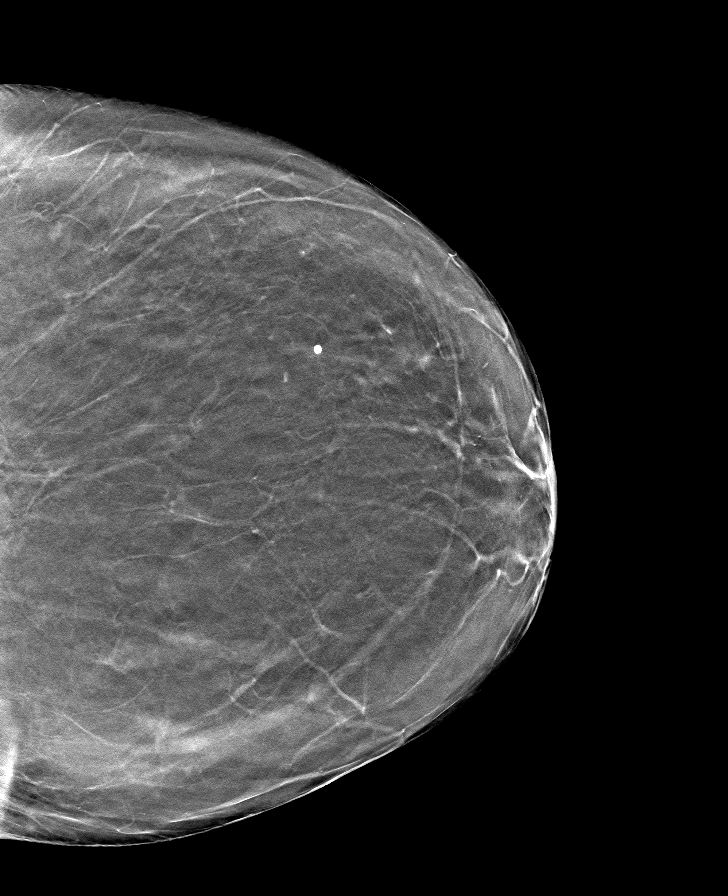

[R CC tomo · tomo slice 37/74.0]
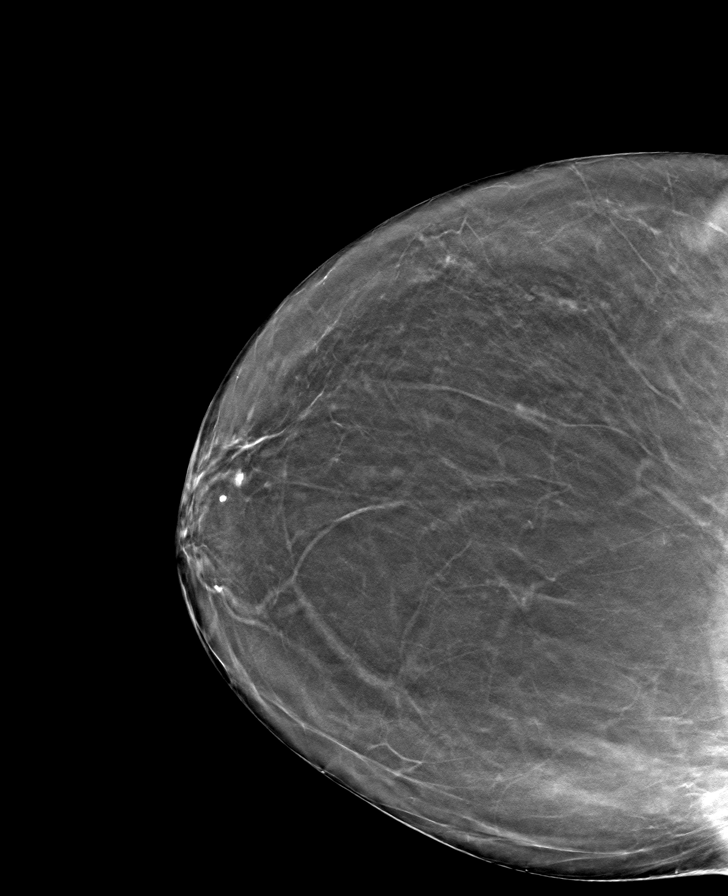

[L MLO tomo · tomo slice 39/76.0]
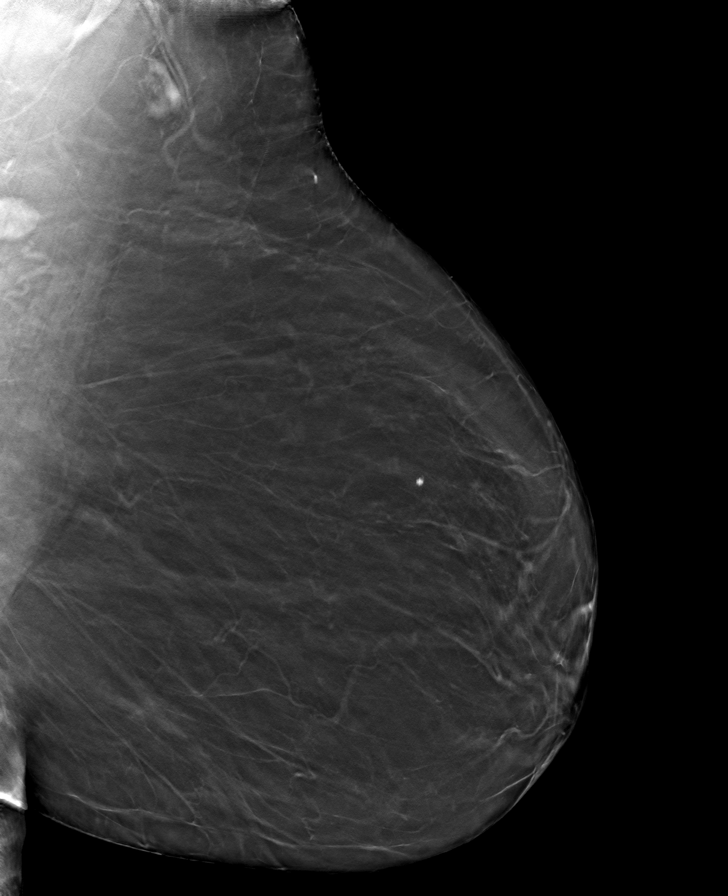

[8 of 24 positions shown; findings below may reference images not displayed]

FINDINGS: There are no findings suspicious for malignancy. Images were
processed with CAD.
IMPRESSION: No mammographic evidence of malignancy. A result letter of this
screening mammogram will be mailed directly to the patient.

RECOMMENDATION:
Screening mammogram in one year. (Code:[id number])

BI-RADS CATEGORY  1: Negative.

## 2022-08-07 DIAGNOSIS — M533 Sacrococcygeal disorders, not elsewhere classified: Secondary | ICD-10-CM | POA: Diagnosis not present

## 2022-08-15 DIAGNOSIS — M533 Sacrococcygeal disorders, not elsewhere classified: Secondary | ICD-10-CM | POA: Diagnosis not present

## 2022-08-15 DIAGNOSIS — M25551 Pain in right hip: Secondary | ICD-10-CM | POA: Diagnosis not present

## 2022-08-21 DIAGNOSIS — M25551 Pain in right hip: Secondary | ICD-10-CM | POA: Diagnosis not present

## 2022-08-21 DIAGNOSIS — M533 Sacrococcygeal disorders, not elsewhere classified: Secondary | ICD-10-CM | POA: Diagnosis not present

## 2022-08-24 DIAGNOSIS — M47816 Spondylosis without myelopathy or radiculopathy, lumbar region: Secondary | ICD-10-CM | POA: Diagnosis not present

## 2022-08-24 DIAGNOSIS — M25551 Pain in right hip: Secondary | ICD-10-CM | POA: Diagnosis not present

## 2022-08-24 DIAGNOSIS — M533 Sacrococcygeal disorders, not elsewhere classified: Secondary | ICD-10-CM | POA: Diagnosis not present

## 2022-08-27 DIAGNOSIS — M533 Sacrococcygeal disorders, not elsewhere classified: Secondary | ICD-10-CM | POA: Diagnosis not present

## 2022-08-27 DIAGNOSIS — M25551 Pain in right hip: Secondary | ICD-10-CM | POA: Diagnosis not present

## 2022-08-30 DIAGNOSIS — B351 Tinea unguium: Secondary | ICD-10-CM | POA: Diagnosis not present

## 2022-08-30 DIAGNOSIS — M79674 Pain in right toe(s): Secondary | ICD-10-CM | POA: Diagnosis not present

## 2022-08-30 DIAGNOSIS — M79675 Pain in left toe(s): Secondary | ICD-10-CM | POA: Diagnosis not present

## 2022-08-31 DIAGNOSIS — M25551 Pain in right hip: Secondary | ICD-10-CM | POA: Diagnosis not present

## 2022-08-31 DIAGNOSIS — M533 Sacrococcygeal disorders, not elsewhere classified: Secondary | ICD-10-CM | POA: Diagnosis not present

## 2022-09-03 DIAGNOSIS — M533 Sacrococcygeal disorders, not elsewhere classified: Secondary | ICD-10-CM | POA: Diagnosis not present

## 2022-09-03 DIAGNOSIS — M25551 Pain in right hip: Secondary | ICD-10-CM | POA: Diagnosis not present

## 2022-09-05 DIAGNOSIS — M25551 Pain in right hip: Secondary | ICD-10-CM | POA: Diagnosis not present

## 2022-09-05 DIAGNOSIS — M533 Sacrococcygeal disorders, not elsewhere classified: Secondary | ICD-10-CM | POA: Diagnosis not present

## 2022-09-11 DIAGNOSIS — M25551 Pain in right hip: Secondary | ICD-10-CM | POA: Diagnosis not present

## 2022-09-11 DIAGNOSIS — M533 Sacrococcygeal disorders, not elsewhere classified: Secondary | ICD-10-CM | POA: Diagnosis not present

## 2022-09-12 DIAGNOSIS — M545 Low back pain, unspecified: Secondary | ICD-10-CM | POA: Diagnosis not present

## 2022-09-12 DIAGNOSIS — M5416 Radiculopathy, lumbar region: Secondary | ICD-10-CM | POA: Diagnosis not present

## 2022-09-14 DIAGNOSIS — M25551 Pain in right hip: Secondary | ICD-10-CM | POA: Diagnosis not present

## 2022-09-14 DIAGNOSIS — M533 Sacrococcygeal disorders, not elsewhere classified: Secondary | ICD-10-CM | POA: Diagnosis not present

## 2022-09-17 DIAGNOSIS — M533 Sacrococcygeal disorders, not elsewhere classified: Secondary | ICD-10-CM | POA: Diagnosis not present

## 2022-09-17 DIAGNOSIS — M47816 Spondylosis without myelopathy or radiculopathy, lumbar region: Secondary | ICD-10-CM | POA: Diagnosis not present

## 2022-09-18 DIAGNOSIS — M25551 Pain in right hip: Secondary | ICD-10-CM | POA: Diagnosis not present

## 2022-09-18 DIAGNOSIS — M533 Sacrococcygeal disorders, not elsewhere classified: Secondary | ICD-10-CM | POA: Diagnosis not present

## 2022-09-20 DIAGNOSIS — F32A Depression, unspecified: Secondary | ICD-10-CM | POA: Diagnosis not present

## 2022-09-20 DIAGNOSIS — G8929 Other chronic pain: Secondary | ICD-10-CM | POA: Diagnosis not present

## 2022-09-20 DIAGNOSIS — M1990 Unspecified osteoarthritis, unspecified site: Secondary | ICD-10-CM | POA: Diagnosis not present

## 2022-09-26 DIAGNOSIS — M25551 Pain in right hip: Secondary | ICD-10-CM | POA: Diagnosis not present

## 2022-09-26 DIAGNOSIS — M533 Sacrococcygeal disorders, not elsewhere classified: Secondary | ICD-10-CM | POA: Diagnosis not present

## 2022-09-27 DIAGNOSIS — I10 Essential (primary) hypertension: Secondary | ICD-10-CM | POA: Diagnosis not present

## 2022-09-27 DIAGNOSIS — R531 Weakness: Secondary | ICD-10-CM | POA: Diagnosis not present

## 2022-09-27 DIAGNOSIS — E039 Hypothyroidism, unspecified: Secondary | ICD-10-CM | POA: Diagnosis not present

## 2022-09-27 DIAGNOSIS — E559 Vitamin D deficiency, unspecified: Secondary | ICD-10-CM | POA: Diagnosis not present

## 2022-09-28 DIAGNOSIS — M533 Sacrococcygeal disorders, not elsewhere classified: Secondary | ICD-10-CM | POA: Diagnosis not present

## 2022-09-28 DIAGNOSIS — M25551 Pain in right hip: Secondary | ICD-10-CM | POA: Diagnosis not present

## 2022-10-03 DIAGNOSIS — M25551 Pain in right hip: Secondary | ICD-10-CM | POA: Diagnosis not present

## 2022-10-03 DIAGNOSIS — M533 Sacrococcygeal disorders, not elsewhere classified: Secondary | ICD-10-CM | POA: Diagnosis not present

## 2022-10-05 DIAGNOSIS — M533 Sacrococcygeal disorders, not elsewhere classified: Secondary | ICD-10-CM | POA: Diagnosis not present

## 2022-10-05 DIAGNOSIS — M25551 Pain in right hip: Secondary | ICD-10-CM | POA: Diagnosis not present

## 2022-10-10 DIAGNOSIS — M533 Sacrococcygeal disorders, not elsewhere classified: Secondary | ICD-10-CM | POA: Diagnosis not present

## 2022-10-10 DIAGNOSIS — M25551 Pain in right hip: Secondary | ICD-10-CM | POA: Diagnosis not present

## 2022-10-17 DIAGNOSIS — M533 Sacrococcygeal disorders, not elsewhere classified: Secondary | ICD-10-CM | POA: Diagnosis not present

## 2022-10-17 DIAGNOSIS — M25551 Pain in right hip: Secondary | ICD-10-CM | POA: Diagnosis not present

## 2022-11-01 DIAGNOSIS — M79674 Pain in right toe(s): Secondary | ICD-10-CM | POA: Diagnosis not present

## 2022-11-01 DIAGNOSIS — M545 Low back pain, unspecified: Secondary | ICD-10-CM | POA: Diagnosis not present

## 2022-11-01 DIAGNOSIS — M79675 Pain in left toe(s): Secondary | ICD-10-CM | POA: Diagnosis not present

## 2022-11-01 DIAGNOSIS — B351 Tinea unguium: Secondary | ICD-10-CM | POA: Diagnosis not present

## 2022-11-19 DIAGNOSIS — M545 Low back pain, unspecified: Secondary | ICD-10-CM | POA: Diagnosis not present

## 2022-11-19 DIAGNOSIS — M533 Sacrococcygeal disorders, not elsewhere classified: Secondary | ICD-10-CM | POA: Diagnosis not present

## 2022-11-19 DIAGNOSIS — M47896 Other spondylosis, lumbar region: Secondary | ICD-10-CM | POA: Diagnosis not present

## 2022-11-20 DIAGNOSIS — L814 Other melanin hyperpigmentation: Secondary | ICD-10-CM | POA: Diagnosis not present

## 2022-11-20 DIAGNOSIS — Z85820 Personal history of malignant melanoma of skin: Secondary | ICD-10-CM | POA: Diagnosis not present

## 2022-11-20 DIAGNOSIS — L821 Other seborrheic keratosis: Secondary | ICD-10-CM | POA: Diagnosis not present

## 2022-11-20 DIAGNOSIS — D1801 Hemangioma of skin and subcutaneous tissue: Secondary | ICD-10-CM | POA: Diagnosis not present

## 2022-11-20 DIAGNOSIS — L720 Epidermal cyst: Secondary | ICD-10-CM | POA: Diagnosis not present

## 2022-11-20 DIAGNOSIS — L570 Actinic keratosis: Secondary | ICD-10-CM | POA: Diagnosis not present

## 2022-12-06 DIAGNOSIS — Z23 Encounter for immunization: Secondary | ICD-10-CM | POA: Diagnosis not present

## 2023-01-03 DIAGNOSIS — M79675 Pain in left toe(s): Secondary | ICD-10-CM | POA: Diagnosis not present

## 2023-01-03 DIAGNOSIS — M79674 Pain in right toe(s): Secondary | ICD-10-CM | POA: Diagnosis not present

## 2023-01-03 DIAGNOSIS — B351 Tinea unguium: Secondary | ICD-10-CM | POA: Diagnosis not present

## 2023-01-09 DIAGNOSIS — Z0000 Encounter for general adult medical examination without abnormal findings: Secondary | ICD-10-CM | POA: Diagnosis not present

## 2023-01-10 DIAGNOSIS — E785 Hyperlipidemia, unspecified: Secondary | ICD-10-CM | POA: Diagnosis not present

## 2023-01-21 DIAGNOSIS — H02831 Dermatochalasis of right upper eyelid: Secondary | ICD-10-CM | POA: Insufficient documentation

## 2023-01-21 DIAGNOSIS — Z961 Presence of intraocular lens: Secondary | ICD-10-CM | POA: Insufficient documentation

## 2023-01-21 DIAGNOSIS — H02834 Dermatochalasis of left upper eyelid: Secondary | ICD-10-CM | POA: Insufficient documentation

## 2023-01-21 DIAGNOSIS — H35372 Puckering of macula, left eye: Secondary | ICD-10-CM | POA: Insufficient documentation

## 2023-02-18 ENCOUNTER — Telehealth: Payer: Self-pay | Admitting: Internal Medicine

## 2023-02-18 DIAGNOSIS — M47896 Other spondylosis, lumbar region: Secondary | ICD-10-CM | POA: Diagnosis not present

## 2023-02-18 DIAGNOSIS — M545 Low back pain, unspecified: Secondary | ICD-10-CM | POA: Diagnosis not present

## 2023-02-18 DIAGNOSIS — M533 Sacrococcygeal disorders, not elsewhere classified: Secondary | ICD-10-CM | POA: Diagnosis not present

## 2023-02-18 NOTE — Telephone Encounter (Signed)
Inbound call from patient, states she has been having diarrhea for about two weeks. Patient states she would like to speak to someone to further advise on medications to take while she waits for her appointment in February. Please advise.

## 2023-02-18 NOTE — Telephone Encounter (Signed)
Left message for pt to call back  °

## 2023-02-19 NOTE — Telephone Encounter (Signed)
Patient called and stated that she was returning [place] call. Patient is requesting a call back. Please advise.

## 2023-02-19 NOTE — Telephone Encounter (Signed)
Pt stated that she has been having diarrhea about once a day for the last 12 days. Pt stated that it started after a christmas party where she had different foods to eat and she had N/V and cramping for 1 day which resolved. Pt was recommended a BRAT diet and not to eat nothing greasy or fatty. Pt was notified to call our office on Monday if her symptoms have not resolved.  Pt verbalized understanding with all questions answered.

## 2023-02-21 ENCOUNTER — Telehealth (HOSPITAL_BASED_OUTPATIENT_CLINIC_OR_DEPARTMENT_OTHER): Payer: Self-pay | Admitting: Internal Medicine

## 2023-02-21 ENCOUNTER — Other Ambulatory Visit (HOSPITAL_BASED_OUTPATIENT_CLINIC_OR_DEPARTMENT_OTHER): Payer: Self-pay | Admitting: Internal Medicine

## 2023-02-21 DIAGNOSIS — Z1231 Encounter for screening mammogram for malignant neoplasm of breast: Secondary | ICD-10-CM

## 2023-02-21 DIAGNOSIS — R197 Diarrhea, unspecified: Secondary | ICD-10-CM | POA: Diagnosis not present

## 2023-03-01 NOTE — Telephone Encounter (Signed)
Patient called and stated that her problem was not getting better and requested for you to return her call. Please advise.

## 2023-03-01 NOTE — Telephone Encounter (Signed)
Spoke with patient & she is still having diarrhea 1-2/daily. She's currently taking imodium & align, but doesn't feel that it is helping. Last seen here in 2020 with Dr. Leone Payor for a colonoscopy. Rescheduled OV to 03/07/23 at 1:30 pm with Marchelle Folks, [place]. She said she recently had lab work done as well, and will have the office fax Marchelle Folks the results to review if needed.

## 2023-03-05 NOTE — Telephone Encounter (Signed)
 Left message for pt to call back

## 2023-03-05 NOTE — Telephone Encounter (Signed)
Inbound call from patient stating that her symptoms have worsened and is requesting a call from the nurse. Please advise.

## 2023-03-07 ENCOUNTER — Other Ambulatory Visit: Payer: Medicare Other

## 2023-03-07 ENCOUNTER — Encounter: Payer: Self-pay | Admitting: Physician Assistant

## 2023-03-07 ENCOUNTER — Ambulatory Visit (INDEPENDENT_AMBULATORY_CARE_PROVIDER_SITE_OTHER): Payer: Medicare Other | Admitting: Physician Assistant

## 2023-03-07 VITALS — BP 126/68 | HR 88 | Ht 63.0 in | Wt 185.0 lb

## 2023-03-07 DIAGNOSIS — R197 Diarrhea, unspecified: Secondary | ICD-10-CM

## 2023-03-07 DIAGNOSIS — R140 Abdominal distension (gaseous): Secondary | ICD-10-CM

## 2023-03-07 DIAGNOSIS — C439 Malignant melanoma of skin, unspecified: Secondary | ICD-10-CM

## 2023-03-07 DIAGNOSIS — Z860100 Personal history of colon polyps, unspecified: Secondary | ICD-10-CM

## 2023-03-07 LAB — CBC WITH DIFFERENTIAL/PLATELET
Basophils Absolute: 0 10*3/uL (ref 0.0–0.1)
Basophils Relative: 0.6 % (ref 0.0–3.0)
Eosinophils Absolute: 0.2 10*3/uL (ref 0.0–0.7)
Eosinophils Relative: 2.4 % (ref 0.0–5.0)
HCT: 38.5 % (ref 36.0–46.0)
Hemoglobin: 13 g/dL (ref 12.0–15.0)
Lymphocytes Relative: 29.1 % (ref 12.0–46.0)
Lymphs Abs: 2.3 10*3/uL (ref 0.7–4.0)
MCHC: 33.8 g/dL (ref 30.0–36.0)
MCV: 91 fL (ref 78.0–100.0)
Monocytes Absolute: 0.7 10*3/uL (ref 0.1–1.0)
Monocytes Relative: 8.3 % (ref 3.0–12.0)
Neutro Abs: 4.8 10*3/uL (ref 1.4–7.7)
Neutrophils Relative %: 59.6 % (ref 43.0–77.0)
Platelets: 208 10*3/uL (ref 150.0–400.0)
RBC: 4.23 Mil/uL (ref 3.87–5.11)
RDW: 13.1 % (ref 11.5–15.5)
WBC: 8 10*3/uL (ref 4.0–10.5)

## 2023-03-07 LAB — COMPREHENSIVE METABOLIC PANEL
ALT: 27 U/L (ref 0–35)
AST: 25 U/L (ref 0–37)
Albumin: 4.3 g/dL (ref 3.5–5.2)
Alkaline Phosphatase: 103 U/L (ref 39–117)
BUN: 22 mg/dL (ref 6–23)
CO2: 27 meq/L (ref 19–32)
Calcium: 9.6 mg/dL (ref 8.4–10.5)
Chloride: 101 meq/L (ref 96–112)
Creatinine, Ser: 0.96 mg/dL (ref 0.40–1.20)
GFR: 55.36 mL/min — ABNORMAL LOW (ref >60.00)
Glucose, Bld: 94 mg/dL (ref 70–99)
Potassium: 3.3 meq/L — ABNORMAL LOW (ref 3.5–5.1)
Sodium: 139 meq/L (ref 135–145)
Total Bilirubin: 0.6 mg/dL (ref 0.2–1.2)
Total Protein: 7.2 g/dL (ref 6.0–8.3)

## 2023-03-07 LAB — TSH: TSH: 1.36 u[IU]/mL (ref 0.35–5.50)

## 2023-03-07 LAB — SEDIMENTATION RATE: Sed Rate: 24 mm/h (ref 0–30)

## 2023-03-07 MED ORDER — LOPERAMIDE HCL 2 MG PO CAPS: 2.0000 mg | ORAL_CAPSULE | ORAL | 0 refills | Status: DC | PRN

## 2023-03-07 MED ORDER — BENEFIBER PO POWD: ORAL | 0 refills | Status: DC

## 2023-03-07 NOTE — Telephone Encounter (Addendum)
 Left message for patient to call back. Pt has appointment today with Marchelle Folks, [place].

## 2023-03-07 NOTE — Patient Instructions (Addendum)
 Your provider has requested that you go to the basement level for lab work before leaving today. Press B on the elevator. The lab is located at the first door on the left as you exit the elevator.   - Can try loperamide  4 mg initially, then 2 mg after each unformed stool for =2 days, with a maximum of 16 mg/day.  - If loperamide  is not working, you could try bismuth salicylate (Pepto-Bismol) 30 mL or two tablets every 30 minutes for eight doses. Pepto-Bismol may make your stools black.   Go to the ER if any severe abdominal pain, fever, or weakness  First do a trial off milk/lactose products if you use them.  Add fiber like benefiber or citracel once a day Increase activity Can do trial of IBGard which is over the counter for AB pain- Take 1-2 capsules once a day for maintence or twice a day during a flare    FODMAP stands for fermentable oligo-, di-, mono-saccharides and polyols (1). These are the scientific terms used to classify groups of carbs that are difficult for our body to digest and that are notorious for triggering digestive symptoms like bloating, gas, loose stools and stomach pain.   You can try low FODMAP diet  - start with eliminating just one column at a time that you feel may be a trigger for you. - the table at the very bottom contains foods that are low in FODMAPs   Sometimes trying to eliminate the FODMAP's from your diet is difficult or tricky, if you are stuggling with trying to do the elimination diet you can try an enzyme.  There is a food enzymes that you sprinkle in or on your food that helps break down the FODMAP. You can read more about the enzyme by going to this site: https://fodzyme.com/

## 2023-03-07 NOTE — Progress Notes (Signed)
 03/07/2023 Courtney Lawrence [id number] June 15, 1940  Referring provider: No ref. provider found Primary GI doctor: Dr. Avram  ASSESSMENT AND PLAN:   Diarrhea last 1-2 months, no alarm symptoms Last colon 2020 unremarkable, no polyps - can increase imodium  to 2 pills twice a day, possible post infectious IBS/IBS - stool samples to rule out infection with UGI and LGI symptoms    -Pancreatic elastase due to AB bloating Can do trial of IBGARD daily Add on citracel/benefiber FODMAP, trial off lactulose and lifestyle changes discussed - some concern Cymbalta may be causing diarrhea but she is doing well with it for pain medications versus microscopic colitis with recent additoin of SNRI, will consider trial off cymbalta- may need repeat colonoscopy to rule out microscopic colitis   Patient Care Team: Signa Dire, MD (Inactive) as PCP - General (Family Medicine)  HISTORY OF PRESENT ILLNESS: 83 y.o. female with a past medical history of melanoma history of colon polyps, family history of colon cancer in sister and father in 61s, hyperlipidemia and others listed below presents for evaluation of diarrhea.   11/04/2018 colonoscopy with Dr. Avram for surveillance excellent bowel prep significant looping diverticula, external/internal hemorrhoids no polyps.   No recall screening colonoscopy secondary to age [age] she had vomiting, AB cramping and diarrhea, she is very tired as well.   Discussed the use of AI scribe software for clinical note transcription with the patient, who gave verbal consent to proceed.  History of Present Illness   The patient, with a history of back pain managed with Cymbalta and a recent pelvic fracture, presented with a chief complaint of persistent diarrhea. The onset of symptoms was traced back to an episode of acute gastroenteritis characterized by severe stomach cramps, vomiting, and diarrhea following a meal on December 13th. Despite the initial assumption of  food poisoning, the diarrhea persisted daily, described as watery in consistency, without associated cramping or further episodes of vomiting.  The patient attempted self-management with daily Imodium ,2 mg once daiy, which did not resolve the symptoms but they did improve.  The patient also reported a significant change in diet, adhering to a BRAT diet, but discontinued it due to lack of improvement. The frequency of bowel movements was reported as once or twice daily, with occasional nocturnal bowel movements during routine bathroom visits. There was no reported blood in the stool or incontinence, but the patient expressed concern about leaving home for extended periods due to the unpredictable nature of the bowel movements.  In response to the persistent diarrhea, the patient discontinued several medications, including gabapentin, but remained on Cymbalta, started approximately four to five months prior to the onset of the diarrhea. The patient denied any recent antibiotic use.  The patient also reported fatigue, but it was unclear whether this was a new symptom or a continuation of her baseline fatigue. There was a reported weight loss of approximately three to four pounds since the onset of the diarrhea. The patient denied any heartburn or difficulty swallowing. The patient also expressed concern about potential contagion and the impact on upcoming travel plans. The patient lives in an independent living facility and expressed fear about being away from home for extended periods due to the diarrhea.     She  reports that she has never smoked. She has never used smokeless tobacco. She reports that she does not drink alcohol and does not use drugs.  RELEVANT LABS AND IMAGING:  CBC    Component Value Date/Time  WBC 10.9 (H) 05/16/2011 0421   RBC 3.04 (L) 05/16/2011 0421   HGB 13.3 09/16/2014 0701   HCT 28.0 (L) 05/16/2011 0421   PLT 151 05/16/2011 0421   MCV 92.1 05/16/2011 0421   MCH 30.6  05/16/2011 0421   MCHC 33.2 05/16/2011 0421   RDW 13.4 05/16/2011 0421   LYMPHSABS 2.7 05/04/2011 1500   MONOABS 0.7 05/04/2011 1500   EOSABS 0.2 05/04/2011 1500   BASOSABS 0.0 05/04/2011 1500   No results for input(s): HGB in the last 8760 hours.  CMP     Component Value Date/Time   NA 131 (L) 05/16/2011 0421   K 4.2 05/16/2011 0421   CL 100 05/16/2011 0421   CO2 27 05/16/2011 0421   GLUCOSE 120 (H) 05/16/2011 0421   BUN 13 05/16/2011 0421   CREATININE 0.70 05/16/2011 0421   CALCIUM 8.4 05/16/2011 0421   GFRNONAA 86 (L) 05/16/2011 0421   GFRAA >90 05/16/2011 0421       No data to display            Current Medications:    Current Outpatient Medications (Cardiovascular):    rosuvastatin (CRESTOR) 5 MG tablet, Take 5 mg by mouth daily.  Current Outpatient Medications (Respiratory):    promethazine  (PHENERGAN ) 12.5 MG tablet, Take 1 tablet (12.5 mg total) by mouth every 6 (six) hours as needed for nausea or vomiting.  Current Outpatient Medications (Analgesics):    celecoxib (CELEBREX) 100 MG capsule, Take 100 mg by mouth daily.   naratriptan (AMERGE) 2.5 MG tablet, Take 2.5 mg by mouth as needed for migraine. Take one (1) tablet at onset of headache; if returns or does not resolve, may repeat after 4 hours; do not exceed five (5) mg in 24 hours.   rizatriptan  (MAXALT ) 10 MG tablet, Take 1 tablet (10 mg total) by mouth as needed for migraine. May repeat in 2 hours if needed   HYDROcodone -acetaminophen  (NORCO/VICODIN) 5-325 MG tablet, Take 1 tablet by mouth every 6 (six) hours as needed. (Patient not taking: Reported on 03/07/2023)  Current Outpatient Medications (Hematological):    Cyanocobalamin  (VITAMIN B-12) 1000 MCG SUBL, Place under the tongue daily.  Current Outpatient Medications (Other):    CALCIUM-MAGNESIUM-VITAMIN D PO, Take 1 tablet by mouth 2 (two) times daily.   Cholecalciferol (VITAMIN D-3) 5000 UNITS TABS, Take by mouth daily.    DULoxetine (CYMBALTA)  30 MG capsule, Take 30 mg by mouth daily.   glucosamine-chondroitin 500-400 MG tablet, Take 1 tablet by mouth daily.    loperamide  (IMODIUM ) 2 MG capsule, Take 1 capsule (2 mg total) by mouth as needed for diarrhea or loose stools.   Multiple Vitamins-Minerals (ICAPS PO), Take 1 tablet by mouth daily.   ondansetron  (ZOFRAN -ODT) 4 MG disintegrating tablet, Take 1 tablet (4 mg total) by mouth every 8 (eight) hours as needed for nausea or vomiting.   Wheat Dextrin (BENEFIBER) POWD, One tablespoon daily  Medical History:  Past Medical History:  Diagnosis Date   Allergy    Anxiety    Diverticulosis 10/28/2008   DJD (degenerative joint disease)    Hyperlipidemia    Migraine    Osteopenia    Personal history of colonic polyp - adenoma 10/30/2013   PONV (postoperative nausea and vomiting)    Posterior tibial tendonitis    left   Seasonal allergies    Urticaria, chronic    Allergies:  Allergies  Allergen Reactions   Platinum-Containing Compounds     swelling   Codeine Nausea And  Vomiting   Aspirin Hives and Other (See Comments)    Headache    Nickel Rash    GETS INFECTION WITH NICKEL EARRINGS   Penicillins     Per pt: family hx allergy to PCN   Yellow Dyes (Non-Tartrazine) Hives     Surgical History:  She  has a past surgical history that includes Foot surgery (Right, 2012 ALSO 2003); Total knee arthroplasty (05/14/2011); Colonoscopy (10/28/2008); Joint replacement (Left); Gastroc recession extremity (Left, 09/16/2014); Calcaneal osteotomy (Left, 09/16/2014); Nevus excision (Right, 11/19/2019); and Lesion removal (Right, 11/19/2019). Family History:  Her family history includes Breast cancer in her maternal grandfather and another family member; Colon cancer (age of onset: 26) in her father; Colon cancer (age of onset: 24) in her sister; Coronary artery disease in her father; Diabetes in her paternal grandfather; Heart attack in her father; Rheum arthritis in her mother.  REVIEW OF  SYSTEMS  : All other systems reviewed and negative except where noted in the History of Present Illness.  PHYSICAL EXAM: BP 126/68   Pulse 88   Ht 5' 3 (1.6 m)   Wt 185 lb (83.9 kg)   SpO2 97%   BMI 32.77 kg/m  General Appearance: Well nourished, in no apparent distress. Head:   Normocephalic and atraumatic. Eyes:  sclerae anicteric,conjunctive pink  Respiratory: Respiratory effort normal, BS equal bilaterally without rales, rhonchi, wheezing. Cardio: RRR with no MRGs. Peripheral pulses intact.  Abdomen: Soft,  Obese ,active bowel sounds. No tenderness . No masses. Rectal: Normal external rectal exam, decreased rectal tone, appreciated internal hemorrhoids, non-tender, no masses, scant brown stool, hemoccult Negative Musculoskeletal: Full ROM, with walker, did not get on table Without edema. Skin:  Dry and intact without significant lesions or rashes Neuro: Alert and  oriented x4;  No focal deficits. Psych:  Cooperative. Normal mood and affect.    Alan JONELLE Coombs, PA-C 3:05 PM

## 2023-03-07 NOTE — Telephone Encounter (Signed)
 Pt returned call & plans to attend OV today. Asked that she arrive 15 minutes early & go to the 2nd floor for her appointment. She asked if should take imodium , but advised her to hold off if able in case PA orders stool kit. Pt verbalized all understanding.

## 2023-03-12 ENCOUNTER — Telehealth: Payer: Self-pay | Admitting: Physician Assistant

## 2023-03-12 NOTE — Telephone Encounter (Signed)
 Pt calling for her [facility] cdiff results. Let her know the results had not come back yet. Printed off results for Alan Coombs PA to review and placed them in her inbox. Results were negative. Let pt know when the labs are reviewed we can call her with the results.

## 2023-03-12 NOTE — Telephone Encounter (Signed)
PT is calling to discuss lab results. Please advise. 

## 2023-03-13 NOTE — Telephone Encounter (Signed)
 Left detailed message for pt that her cdiff test was negative. Also instructed her to return the pancreatic elastase if she has not already done so.

## 2023-03-13 NOTE — Telephone Encounter (Signed)
 Inbound call from patient requesting a call to discuss results further. Please advise, thank you.

## 2023-03-13 NOTE — Telephone Encounter (Signed)
 Pt states she just listened to the voicemail and has no other questions.

## 2023-03-18 ENCOUNTER — Other Ambulatory Visit: Payer: Self-pay

## 2023-03-18 ENCOUNTER — Other Ambulatory Visit (INDEPENDENT_AMBULATORY_CARE_PROVIDER_SITE_OTHER): Payer: Medicare Other

## 2023-03-18 DIAGNOSIS — R790 Abnormal level of blood mineral: Secondary | ICD-10-CM | POA: Diagnosis not present

## 2023-03-18 DIAGNOSIS — E876 Hypokalemia: Secondary | ICD-10-CM

## 2023-03-18 DIAGNOSIS — R197 Diarrhea, unspecified: Secondary | ICD-10-CM

## 2023-03-18 LAB — POTASSIUM: Potassium: 4.2 meq/L (ref 3.5–5.1)

## 2023-03-18 LAB — MAGNESIUM: Magnesium: 2.2 mg/dL (ref 1.5–2.5)

## 2023-03-21 ENCOUNTER — Telehealth: Payer: Self-pay | Admitting: Physician Assistant

## 2023-03-21 NOTE — Telephone Encounter (Signed)
Inbound call from patient requesting a call to discuss 1/13 lab results. States she is unable to see results in [place]. Please advise, thank you.

## 2023-03-21 NOTE — Telephone Encounter (Signed)
Patient is advised of lab results/recommendations as per Quentin Mulling, PA-C. Advised that pancreatic elastase testing has not yet returned, so when we go get that result, we will be in touch. Patient verbalizes understanding.

## 2023-03-23 LAB — PANCREATIC ELASTASE, FECAL: Pancreatic Elastase-1, Stool: 190 ug/g — ABNORMAL LOW (ref >200)

## 2023-03-25 MED ORDER — PANCRELIPASE (LIP-PROT-AMYL) 36000-114000 UNITS PO CPEP: ORAL_CAPSULE | ORAL | Status: DC

## 2023-04-01 ENCOUNTER — Telehealth: Payer: Self-pay | Admitting: Physician Assistant

## 2023-04-01 ENCOUNTER — Encounter (HOSPITAL_BASED_OUTPATIENT_CLINIC_OR_DEPARTMENT_OTHER): Payer: Self-pay

## 2023-04-01 ENCOUNTER — Ambulatory Visit (HOSPITAL_BASED_OUTPATIENT_CLINIC_OR_DEPARTMENT_OTHER)
Admission: RE | Admit: 2023-04-01 | Discharge: 2023-04-01 | Disposition: A | Discharge status: Home / Self Care | Payer: Medicare Other | Source: Ambulatory Visit | Attending: Internal Medicine | Admitting: Internal Medicine

## 2023-04-01 DIAGNOSIS — Z1231 Encounter for screening mammogram for malignant neoplasm of breast: Secondary | ICD-10-CM | POA: Insufficient documentation

## 2023-04-01 NOTE — Telephone Encounter (Signed)
Patient daughter called and stated that she was wanting to know what was going on exactly with her mother, due to her mother having a lot of pain and discomfort. Patient daughter is requesting a call back at [phone]. Please advise.

## 2023-04-02 ENCOUNTER — Ambulatory Visit (INDEPENDENT_AMBULATORY_CARE_PROVIDER_SITE_OTHER): Payer: Medicare Other | Admitting: Physician Assistant

## 2023-04-02 ENCOUNTER — Encounter: Payer: Self-pay | Admitting: Physician Assistant

## 2023-04-02 DIAGNOSIS — R197 Diarrhea, unspecified: Secondary | ICD-10-CM | POA: Diagnosis not present

## 2023-04-02 MED ORDER — LOPERAMIDE HCL 2 MG PO CAPS: 2.0000 mg | ORAL_CAPSULE | Freq: Every day | ORAL | 0 refills | Status: DC

## 2023-04-02 MED ORDER — PANCRELIPASE (LIP-PROT-AMYL) 36000-114000 UNITS PO CPEP: ORAL_CAPSULE | ORAL | 1 refills | Status: DC

## 2023-04-02 NOTE — Telephone Encounter (Signed)
Pts daughter called and states her mother was seen in early Dec for diarrhea. Reports her pan enzymes were low so she was placed on pan enzymes to take with meals. She is also taking benefiber and Imodium and is still having issues with diarrhea. Pts daughter wants her to be seen sooner. Pt scheduled to see Hyacinth Meeker PA today at 1:30pm. Pt aware of appt.

## 2023-04-02 NOTE — Progress Notes (Addendum)
Chief Complaint:  Diarrhea and Abdominal Pain  HPI:    Courtney Lawrence is an 83 year old female, known to Dr. Leone Payor, with a past medical history as listed below including family history of colon cancer in her sister and father in their 47s, anxiety and multiple others, who presents to clinic today for follow-up of diarrhea.    11/04/2018 colonoscopy for surveillance with excellent bowel prep, significant looping, diverticula, external and internal hemorrhoids and no polyps.  No recall recommended due to age.    03/07/2023 patient seen in clinic by Courtney Mulling, PA-C for diarrhea.  At that time symptoms for the last 1 to 2 months.  Apparently started after an episode of acute gastroenteritis characterized by severe stomach cramps, vomiting and diarrhea following a meal on December 13.  Initially Imodium 2 mg once daily did not resolve symptoms.  Frequency was once to twice a day with occasional nocturnal bowel movements.  Stool samples were negative for infection, pancreatic elastase turned out to be low and patient started on Creon on 03/25/2023, 2 with a meal and 1 with a snack.  Also told to do a trial of IBgard daily.  Also told to add fiber.  Trial off lactulose.  That time discussed Cymbalta may be causing diarrhea, could consider colonoscopy for microscopic colitis.    03/18/2023 fecal pancreatic elastase returned minimally low, patient told to start Creon 36,000 units 2 with meals and 1 with snack on 03/25/2023.    03/18/2023 potassium and magnesium normal.    Today, patient returns to clinic with her daughter and also her son on the phone who help assist with history and ask questions.  She explains that she started the Creon 2 with a meal and 1 with a snack (though it sounds like she has maybe been snacking sometimes without it) over the past week.  She has not noticed a whole lot of difference in her stools though maybe sounds like twice she has had occasional form to her stool.  Tells me that she has  tried all of the diet changes she knows and nothing really seems to matter.  Does tell me her stools are urgent when she has them but typically she will have 1 in the morning and then 1 closer to the late afternoon, does not typically wake her from sleep.  She has also been doing Benefiber 1 tablespoon a day and taking her Loperamide 1 tab once daily.  Family and she are asking what to do next and what this "pancreatic insufficiency means".    Remains on Cymbalta which is working well for her back pain.    Denies fever, chills or weight loss.  Past Medical History:  Diagnosis Date   Allergy    Anxiety    Diverticulosis 10/28/2008   DJD (degenerative joint disease)    Hyperlipidemia    Migraine    Osteopenia    Personal history of colonic polyp - adenoma 10/30/2013   PONV (postoperative nausea and vomiting)    Posterior tibial tendonitis    left   Seasonal allergies    Urticaria, chronic     Past Surgical History:  Procedure Laterality Date   CALCANEAL OSTEOTOMY Left 09/16/2014   Procedure: CALCANEAL OSTEOTOMY;  Surgeon: Toni Arthurs, MD;  Location:  SUR[facility]ERY [facility];  Service: Orthopedics;  Laterality: Left;   COLONOSCOPY  10/28/2008   Dr. Gaylyn Lambert   FOOT SURGERY Right 2012 ALSO 2003   GASTROC RECESSION EXTREMITY Left 09/16/2014   Procedure: LEFT  GASTROCNEMIUS RECESSION ;  Surgeon: Toni Arthurs, MD;  Location: [facility];  Service: Orthopedics;  Laterality: Left;   JOINT REPLACEMENT Left    knee   LESION REMOVAL Right 11/19/2019   Procedure: Excision of right arm dysplasia;  Surgeon: Peggye Form, DO;  Location: [facility];  Service: Plastics;  Laterality: Right;   NEVUS EXCISION Right 11/19/2019   Procedure: Excision of right foot melanoma;  Surgeon: Peggye Form, DO;  Location: [facility];  Service: Plastics;  Laterality: Right;   TOTAL KNEE ARTHROPLASTY  05/14/2011   Procedure: TOTAL KNEE ARTHROPLASTY;   Surgeon: Shelda Pal, MD;  Location: [facility];  Service: Orthopedics;  Laterality: Left;    Current Outpatient Medications  Medication Sig Dispense Refill   CALCIUM-MAGNESIUM-VITAMIN D PO Take 1 tablet by mouth 2 (two) times daily.     celecoxib (CELEBREX) 100 MG capsule Take 100 mg by mouth daily.     Cholecalciferol (VITAMIN D-3) 5000 UNITS TABS Take by mouth daily.      Cyanocobalamin (VITAMIN B-12) 1000 MCG SUBL Place under the tongue daily.     DULoxetine (CYMBALTA) 30 MG capsule Take 30 mg by mouth daily.     glucosamine-chondroitin 500-400 MG tablet Take 1 tablet by mouth daily.      HYDROcodone-acetaminophen (NORCO/VICODIN) 5-325 MG tablet Take 1 tablet by mouth every 6 (six) hours as needed. (Patient not taking: Reported on 03/07/2023) 12 tablet 0   lipase/protease/amylase (CREON) 36000 UNITS CPEP capsule 2 capsules before meals, 1 capsule before snacks     loperamide (IMODIUM) 2 MG capsule Take 1 capsule (2 mg total) by mouth as needed for diarrhea or loose stools. 90 capsule 0   Multiple Vitamins-Minerals (ICAPS PO) Take 1 tablet by mouth daily.     naratriptan (AMERGE) 2.5 MG tablet Take 2.5 mg by mouth as needed for migraine. Take one (1) tablet at onset of headache; if returns or does not resolve, may repeat after 4 hours; do not exceed five (5) mg in 24 hours.     ondansetron (ZOFRAN-ODT) 4 MG disintegrating tablet Take 1 tablet (4 mg total) by mouth every 8 (eight) hours as needed for nausea or vomiting. 20 tablet 0   promethazine (PHENERGAN) 12.5 MG tablet Take 1 tablet (12.5 mg total) by mouth every 6 (six) hours as needed for nausea or vomiting. 30 tablet 0   rizatriptan (MAXALT) 10 MG tablet Take 1 tablet (10 mg total) by mouth as needed for migraine. May repeat in 2 hours if needed 10 tablet 2   rosuvastatin (CRESTOR) 5 MG tablet Take 5 mg by mouth daily.     Wheat Dextrin (BENEFIBER) POWD One tablespoon daily 500 g 0   No current facility-administered medications for this  visit.    Allergies as of 04/02/2023 - Review Complete 04/02/2023  Allergen Reaction Noted   Platinum-containing compounds  05/14/2011   Codeine Nausea And Vomiting 06/08/2013   Aspirin Hives and Other (See Comments) 05/03/2011   Nickel Rash 05/04/2011   Penicillins  06/08/2013   Yellow dyes (non-tartrazine) Hives 05/03/2011    Family History  Problem Relation Age of Onset   Colon cancer Father 80   Heart attack Father    Coronary artery disease Father    Colon cancer Sister 32   Rheum arthritis Mother    Diabetes Paternal Grandfather    Breast cancer Maternal Grandfather    Breast cancer Other    Esophageal cancer Neg Hx  Stomach cancer Neg Hx    Rectal cancer Neg Hx     Social History   Socioeconomic History   Marital status: Widowed    Spouse name: Not on file   Number of children: 3   Years of education: Not on file   Highest education level: Not on file  Occupational History   Occupation: president [employer]   Occupation: Retired  Tobacco Use   Smoking status: Never   Smokeless tobacco: Never  Vaping Use   Vaping status: Never Used  Substance and Sexual Activity   Alcohol use: Never    Alcohol/week: 1.0 standard drink of alcohol    Types: 1 Standard drinks or equivalent per week   Drug use: No   Sexual activity: Not on file  Other Topics Concern   Not on file  Social History Narrative   Married, she is retired Education officer, environmental. One son two daughters Lennox Laity Dvsokin is one)   . 2-3 caffeinated drinks a day.   Social Drivers of Corporate investment banker Strain: Not on file  Food Insecurity: Not on file  Transportation Needs: Not on file  Physical Activity: Not on file  Stress: Not on file  Social Connections: Not on file  Intimate Partner Violence: Not on file    Review of Systems:    Constitutional: No weight loss, fever or chills  Cardiovascular: No chest pain Respiratory: No SOB  Gastrointestinal: See HPI and otherwise negative    Physical Exam:  Vital signs: BP 90/60 (BP Location: Left Arm, Patient Position: Sitting, Cuff Size: Large)   Pulse 80   Ht 5\' 3"  (1.6 m)   Wt 181 lb (82.1 kg)   BMI 32.06 kg/m    Constitutional:   Pleasant Elderly Caucasian female appears to be in NAD, Well developed, Well nourished, alert and cooperative Respiratory: Respirations even and unlabored. Lungs clear to auscultation bilaterally.   No wheezes, crackles, or rhonchi.  Cardiovascular: Normal S1, S2. No MRG. Regular rate and rhythm. No peripheral edema, cyanosis or pallor.  Gastrointestinal:  Soft, nondistended, nontender. No rebound or guarding. Normal bowel sounds. No appreciable masses or hepatomegaly. Rectal:  Not performed.  Psychiatric: Oriented to person, place and time. Demonstrates good judgement and reason without abnormal affect or behaviors. +anxiety  RELEVANT LABS AND IMAGING: CBC    Component Value Date/Time   WBC 8.0 03/07/2023 1431   RBC 4.23 03/07/2023 1431   HGB 13.0 03/07/2023 1431   HCT 38.5 03/07/2023 1431   PLT 208.0 03/07/2023 1431   MCV 91.0 03/07/2023 1431   MCH 30.6 05/16/2011 0421   MCHC 33.8 03/07/2023 1431   RDW 13.1 03/07/2023 1431   LYMPHSABS 2.3 03/07/2023 1431   MONOABS 0.7 03/07/2023 1431   EOSABS 0.2 03/07/2023 1431   BASOSABS 0.0 03/07/2023 1431    CMP     Component Value Date/Time   NA 139 03/07/2023 1431   K 4.2 03/18/2023 1113   CL 101 03/07/2023 1431   CO2 27 03/07/2023 1431   GLUCOSE 94 03/07/2023 1431   BUN 22 03/07/2023 1431   CREATININE 0.96 03/07/2023 1431   CALCIUM 9.6 03/07/2023 1431   PROT 7.2 03/07/2023 1431   ALBUMIN 4.3 03/07/2023 1431   AST 25 03/07/2023 1431   ALT 27 03/07/2023 1431   ALKPHOS 103 03/07/2023 1431   BILITOT 0.6 03/07/2023 1431   GFRNONAA 86 (L) 05/16/2011 0421   GFRAA >90 05/16/2011 0421    Assessment: 1.  Diarrhea: Initially started after a  gastroenteritis, seen in clinic 03/07/2023 with GI path panel via [facility] negative, fecal  pancreatic elastase minimally decreased and patient started on Creon 2 with a meal and 1 with a snack on 03/25/2023 along with her daily fiber and Imodium, sounds like she is only had 2 formed stools over the past week since starting Creon and these were minimally formed, continues with urgency, still hindering her life to a point, still on Cymbalta; consider true pancreatic insufficiency first microscopic colitis versus postinfectious IBS  Plan: 1.  For now discussed with the family I do not feel like we have been on the Creon long enough to see if it is helping at all.  We will go ahead and increase it today, 3 with a meal and 2 with a snack.  She was prescribed more. 2.  Also will increase Imodium to 1 tab twice daily (was told to do this at last visit but it does not like she sound like she has been doing it).  Did discuss that if she becomes constipated then I would stop the Imodium. 3.  Continue Benefiber 1 heaping tablespoon daily 4.  Discussed with the family and the patient that if she is no better at her follow-up with Courtney Lawrence on February 19 then they can discuss setting her up for a colonoscopy for further evaluation of microscopic colitis. 5.  Patient is having great benefit from the Cymbalta.  Told her not to stop this medication.  If we have question of microscopic colitis she would benefit from a colonoscopy.   6.  If the Creon starts working and she is doing well then may need to consider CT with pancreatic protocol to ensure that there is nothing organically wrong there.  Discussed this with the patient and her family.  If the Creon does not help then can just discontinue and proceed with colonoscopy as above.  Did discuss that fecal pancreatic elastase can be falsely decreased in the setting of diarrhea. 7.  Patient to follow in clinic with [facility] as scheduled February 19.  They will call before then if having issues and at that point would recommend a colonoscopy with Dr. Leone Payor in the  [facility]. 8.  Of note patient's son specifically asked about elevated liver enzymes, these were not elevated on 03/07/2023.  Also asked about a hemoglobin A1c.  I do not see one in her chart, but patient believes her PCP has done 1 in the past.  They should follow-up with them about this.  They discuss a family history of autoimmune disorders in various forms.  Courtney Meeker, PA-C [facility] 04/02/2023, 1:24 PM   GI Clinic MD:   Agree w/ above - it is also possible the fecal elastase is spuriously low due to a dilutional effect.  Courtney Boop, MD, [facility]

## 2023-04-02 NOTE — Patient Instructions (Addendum)
DO NOT STOP CYMBALTA.   We have sent the following medications to your pharmacy for you to pick up at your convenience: Creon 3 capsules with meals and 2 capsules with snacks  Loperamide 2 mg twice daily.    Benefiber 1 tablespoon daily.   _______________________________________________________  If your blood pressure at your visit was 140/90 or greater, please contact your primary care physician to follow up on this.  _______________________________________________________  If you are age 28 or older, your body mass index should be between 23-30. Your Body mass index is 32.06 kg/m. If this is out of the aforementioned range listed, please consider follow up with your Primary Care Provider.  If you are age 70 or younger, your body mass index should be between 19-25. Your Body mass index is 32.06 kg/m. If this is out of the aformentioned range listed, please consider follow up with your Primary Care Provider.   ________________________________________________________  The [place] providers would like to encourage you to use [facility] to communicate with providers for non-urgent requests or questions.  Due to long hold times on the telephone, sending your provider a message by [facility] may be a faster and more efficient way to get a response.  Please allow 48 business hours for a response.  Please remember that this is for non-urgent requests.  _______________________________________________________

## 2023-04-04 ENCOUNTER — Telehealth: Payer: Self-pay | Admitting: Physician Assistant

## 2023-04-04 DIAGNOSIS — R197 Diarrhea, unspecified: Secondary | ICD-10-CM

## 2023-04-04 MED ORDER — LOPERAMIDE HCL 2 MG PO CAPS: 2.0000 mg | ORAL_CAPSULE | Freq: Every day | ORAL | 0 refills | Status: DC

## 2023-04-04 MED ORDER — PANCRELIPASE (LIP-PROT-AMYL) 36000-114000 UNITS PO CPEP: ORAL_CAPSULE | ORAL | 3 refills | Status: DC

## 2023-04-04 NOTE — Telephone Encounter (Signed)
PT is calling to let us know that the pharmacy never received the prescription for creon on imodium. They advised they need new prescriptions since the dosages were changed. Requesting call back

## 2023-04-04 NOTE — Telephone Encounter (Signed)
Scripts sent to pharmacy

## 2023-04-18 ENCOUNTER — Ambulatory Visit: Payer: Medicare Other | Admitting: Physician Assistant

## 2023-04-22 ENCOUNTER — Telehealth: Payer: Self-pay | Admitting: Physician Assistant

## 2023-04-22 NOTE — Telephone Encounter (Signed)
 Inbound call from patient's daughter requesting to know if patient is able to have 2/19 changed to a virtual appointment due to weather. Daughter is requesting a call back. Please advise, thank you.

## 2023-04-24 ENCOUNTER — Ambulatory Visit (INDEPENDENT_AMBULATORY_CARE_PROVIDER_SITE_OTHER): Payer: Medicare Other | Admitting: Physician Assistant

## 2023-04-24 ENCOUNTER — Encounter: Payer: Self-pay | Admitting: Physician Assistant

## 2023-04-24 VITALS — BP 114/82 | HR 55 | Ht 63.0 in | Wt 183.0 lb

## 2023-04-24 DIAGNOSIS — R197 Diarrhea, unspecified: Secondary | ICD-10-CM

## 2023-04-24 MED ORDER — RIFAXIMIN 550 MG PO TABS: 550.0000 mg | ORAL_TABLET | Freq: Three times a day (TID) | ORAL | 0 refills | Status: DC

## 2023-04-24 MED ORDER — SUFLAVE 178.7 G PO SOLR: 1.0000 | Freq: Once | ORAL | 0 refills | Status: AC

## 2023-04-24 MED ORDER — RIFAXIMIN 550 MG PO TABS: 550.0000 mg | ORAL_TABLET | Freq: Three times a day (TID) | ORAL | 0 refills | Status: AC

## 2023-04-24 NOTE — Patient Instructions (Addendum)
 You have been scheduled for a colonoscopy. Please follow written instructions given to you at your visit today.   If you use inhalers (even only as needed), please bring them with you on the day of your procedure.  DO NOT TAKE 7 DAYS PRIOR TO TEST- Trulicity (dulaglutide) Ozempic, Wegovy (semaglutide) Mounjaro (tirzepatide) Bydureon Bcise (exanatide extended release)  DO NOT TAKE 1 DAY PRIOR TO YOUR TEST Rybelsus (semaglutide) Adlyxin (lixisenatide) Victoza (liraglutide) Byetta (exanatide) ___________________________________________________________________________  Courtney Lawrence will receive your bowel preparation through [facility], which ensures the lowest copay and home delivery, with outreach via text or call from an [phone] number. Please respond promptly to avoid rescheduling of your procedure. If you are interested in alternative options or have any questions regarding your prep, please contact them at [phone] ____________________________________________________________________________  Your Provider Has Sent Your Bowel Prep Regimen To [facility]   [facility] will contact you to verify your information and collect your copay, if applicable. Enjoy the comfort of your home while your prescription is mailed to you, FREE of any shipping charges.   [facility] accepts all major insurance benefits and applies discounts & coupons.  Have additional questions?   Chat: [email or web address] Call: [phone] Email: [email or web address] NCPDP: [id number]  How will [facility] contact you?  With a Welcome phone call,  a Welcome text and a checkout link in text form.  Texts you receive from [phone] Are NOT Spam.  *To set up delivery, you must complete the checkout process via link or speak to one of the patient care representatives. If [facility] is unable to reach you, your prescription may be delayed.  To avoid long hold times on the phone, you may also utilize the secure chat  feature on the [facility] website to request that they call you back for transaction completion or to expedite your concerns.   Small intestinal bacterial overgrowth (SIBO) occurs when there is an abnormal increase in the overall bacterial population in the small intestine -- particularly types of bacteria not commonly found in that part of the digestive tract. Small intestinal bacterial overgrowth (SIBO) commonly results when a circumstance -- such as surgery or disease -- slows the passage of food and waste products in the digestive tract, creating a breeding ground for bacteria.  Signs and symptoms of SIBO often include: Loss of appetite Abdominal pain Nausea Bloating An uncomfortable feeling of fullness after eating Diarrhea or constipation, depending on the type of gas produced  What foods trigger SIBO? While foods aren't the original cause of SIBO, certain foods do encourage the overgrowth of the wrong bacteria in your small intestine. If you're feeding them their favorite foods, they're going to grow more, and that will trigger more of your SIBO symptoms. By the same token, you can help reduce the overgrowth by starving the problematic bacteria of their favorite foods. This strategy has led to a number of proposed SIBO eating plans. The plans vary, and so do individual results. But in general, they tend to recommend limiting carbohydrates.  These include: Sugars and sweeteners. Fruits and starchy vegetables. Dairy products. Grains.  Stop the creon Continue imodium and benefiber Your symptoms are very suspicious for this condition, as discussed, we will start you on an antibiotic to see if this helps, Xifaxin for 2 weeks.  If this does not help we will plan for the colonoscopy to evaluate for microscopic colitis  Continue cymbalta for your back pain  _______________________________________________________  If your blood pressure at your visit was 140/90 or greater, please  contact your primary care physician to follow up on this.  _______________________________________________________  If you are age 82 or older, your body mass index should be between 23-30. Your Body mass index is 32.42 kg/m. If this is out of the aforementioned range listed, please consider follow up with your Primary Care Provider.  If you are age 82 or younger, your body mass index should be between 19-25. Your Body mass index is 32.42 kg/m. If this is out of the aformentioned range listed, please consider follow up with your Primary Care Provider.   ________________________________________________________  The [facility] providers would like to encourage you to use [facility] to communicate with providers for non-urgent requests or questions.  Due to long hold times on the telephone, sending your provider a message by [facility] may be a faster and more efficient way to get a response.  Please allow 48 business hours for a response.  Please remember that this is for non-urgent requests.  _______________________________________________________  Due to recent changes in healthcare laws, you may see the results of your imaging and laboratory studies on MyChart before your provider has had a chance to review them.  We understand that in some cases there may be results that are confusing or concerning to you. Not all laboratory results come back in the same time frame and the provider may be waiting for multiple results in order to interpret others.  Please give [place] 48 hours in order for your provider to thoroughly review all the results before contacting the office for clarification of your results.   Thank you for entrusting me with your care and choosing [facility].  Quentin Mulling PA-C

## 2023-04-24 NOTE — Progress Notes (Signed)
 04/24/2023 AUDA FINFROCK [id number] [birth year]  Referring provider: No ref. provider found Primary GI doctor: Dr. Leone Payor  ASSESSMENT AND PLAN:   Diarrhea, no alarm symptoms, 1-2 x a day occ nocturnal symptoms Last colon 2020 unremarkable, no polyps  Negative stool test for infection Had low pancreatic elastase but no response to creon, likely false positive, stop creon Continue imodium/benefiber Will do trial of xifaxin for SIBO/IBS-D Schedule for colonoscopy to rule out microscopic colitis in April with Dr. Leone Payor at [facility] We have discussed the risks of bleeding, infection, perforation, medication reactions, and remote risk of death associated with colonoscopy. All questions were answered and the patient acknowledges these risk and wishes to proceed. FODMAP, trial off lactulose and lifestyle changes discussed   Patient Care Team: System, Provider Not In as PCP - General  HISTORY OF PRESENT ILLNESS: 83 y.o. female with a past medical history of family history of colon cancer in her sister and father in their 24s, anxiety and multiple others and others listed below presents for evaluation of Diarrhea.   11/04/2018 colonoscopy for surveillance with excellent bowel prep, significant looping, diverticula, external and internal hemorrhoids and no polyps. No recall recommended due to age.  Patient was originally seen 03/07/2023 for diarrhea had negative stool studies for infection, pancreatic elastase was low however fecal fat was not done so this could be false positive.   Was given trial of Creon but this has not been helping.  Patient was on Benefiber and Imodium twice daily which has improved stools slightly.  She continues to have diarrhea, can be pure water to can be small pieces of stool. Can have nocturnal symptoms.  She has AB bloating and can feel better after a BM. Denies weight loss.  She gets full very quickly, she eats small meals throughout the day.   No AB pain, nausea,  vomiting.   Wt Readings from Last 3 Encounters:  04/24/23 183 lb (83 kg)  04/02/23 181 lb (82.1 kg)  03/07/23 185 lb (83.9 kg)    She  reports that she has never smoked. She has never used smokeless tobacco. She reports that she does not drink alcohol and does not use drugs.  RELEVANT GI HISTORY, LABS, IMAGING:  CBC    Component Value Date/Time   WBC 8.0 03/07/2023 1431   RBC 4.23 03/07/2023 1431   HGB 13.0 03/07/2023 1431   HCT 38.5 03/07/2023 1431   PLT 208.0 03/07/2023 1431   MCV 91.0 03/07/2023 1431   MCH 30.6 05/16/2011 0421   MCHC 33.8 03/07/2023 1431   RDW 13.1 03/07/2023 1431   LYMPHSABS 2.3 03/07/2023 1431   MONOABS 0.7 03/07/2023 1431   EOSABS 0.2 03/07/2023 1431   BASOSABS 0.0 03/07/2023 1431   Recent Labs    03/07/23 1431  HGB 13.0    CMP     Component Value Date/Time   NA 139 03/07/2023 1431   K 4.2 03/18/2023 1113   CL 101 03/07/2023 1431   CO2 27 03/07/2023 1431   GLUCOSE 94 03/07/2023 1431   BUN 22 03/07/2023 1431   CREATININE 0.96 03/07/2023 1431   CALCIUM 9.6 03/07/2023 1431   PROT 7.2 03/07/2023 1431   ALBUMIN 4.3 03/07/2023 1431   AST 25 03/07/2023 1431   ALT 27 03/07/2023 1431   ALKPHOS 103 03/07/2023 1431   BILITOT 0.6 03/07/2023 1431   GFRNONAA 86 (L) 05/16/2011 0421   GFRAA >90 05/16/2011 0421      Latest Ref Rng & Units  03/07/2023    2:31 PM  Hepatic Function  Total Protein 6.0 - 8.3 g/dL 7.2   Albumin 3.5 - 5.2 g/dL 4.3   AST 0 - 37 U/L 25   ALT 0 - 35 U/L 27   Alk Phosphatase 39 - 117 U/L 103   Total Bilirubin 0.2 - 1.2 mg/dL 0.6       Current Medications:    Current Outpatient Medications (Cardiovascular):    rosuvastatin (CRESTOR) 5 MG tablet, Take 5 mg by mouth daily. (Patient not taking: Reported on 04/24/2023)  Current Outpatient Medications (Respiratory):    promethazine (PHENERGAN) 12.5 MG tablet, Take 1 tablet (12.5 mg total) by mouth every 6 (six) hours as needed for nausea or vomiting.  Current Outpatient  Medications (Analgesics):    celecoxib (CELEBREX) 100 MG capsule, Take 100 mg by mouth daily.   naratriptan (AMERGE) 2.5 MG tablet, Take 2.5 mg by mouth as needed for migraine. Take one (1) tablet at onset of headache; if returns or does not resolve, may repeat after 4 hours; do not exceed five (5) mg in 24 hours.   rizatriptan (MAXALT) 10 MG tablet, Take 1 tablet (10 mg total) by mouth as needed for migraine. May repeat in 2 hours if needed  Current Outpatient Medications (Hematological):    Cyanocobalamin (VITAMIN B-12) 1000 MCG SUBL, Place under the tongue daily.  Current Outpatient Medications (Other):    CALCIUM-MAGNESIUM-VITAMIN D PO, Take 1 tablet by mouth 2 (two) times daily.   Cholecalciferol (VITAMIN D-3) 5000 UNITS TABS, Take by mouth daily.    DULoxetine (CYMBALTA) 30 MG capsule, Take 30 mg by mouth daily.   gabapentin (NEURONTIN) 100 MG capsule, Take 100 mg by mouth 2 (two) times daily. (Patient taking differently: Take 100 mg by mouth as needed.)   glucosamine-chondroitin 500-400 MG tablet, Take 1 tablet by mouth daily.    lipase/protease/amylase (CREON) 36000 UNITS CPEP capsule, 3 capsules before meals, 2 capsule before snacks   loperamide (IMODIUM) 2 MG capsule, Take 1 capsule (2 mg total) by mouth daily.   Multiple Vitamins-Minerals (ICAPS PO), Take 1 tablet by mouth daily.   ondansetron (ZOFRAN-ODT) 4 MG disintegrating tablet, Take 1 tablet (4 mg total) by mouth every 8 (eight) hours as needed for nausea or vomiting.   SUFLAVE 178.7 g SOLR, Take 1 kit by mouth once for 1 dose.   Wheat Dextrin (BENEFIBER) POWD, One tablespoon daily   rifaximin (XIFAXAN) 550 MG TABS tablet, Take 1 tablet (550 mg total) by mouth 3 (three) times daily for 14 days.   tiZANidine (ZANAFLEX) 2 MG tablet, Take 2 mg by mouth at bedtime. (Patient not taking: Reported on 04/24/2023)  Medical History:  Past Medical History:  Diagnosis Date   Allergy    Anxiety    Diverticulosis 10/28/2008   DJD  (degenerative joint disease)    Hyperlipidemia    Migraine    Osteopenia    Personal history of colonic polyp - adenoma 10/30/2013   PONV (postoperative nausea and vomiting)    Posterior tibial tendonitis    left   Seasonal allergies    Urticaria, chronic    Allergies:  Allergies  Allergen Reactions   Platinum-Containing Compounds     swelling   Codeine Nausea And Vomiting   Aspirin Hives and Other (See Comments)    Headache    Nickel Rash    GETS "INFECTION" WITH NICKEL EARRINGS   Penicillins     Per pt: family hx allergy to PCN   Yellow  Dyes (Non-Tartrazine) Hives     Surgical History:  She  has a past surgical history that includes Foot surgery (Right, 2012 ALSO 2003); Total knee arthroplasty (05/14/2011); Colonoscopy (10/28/2008); Joint replacement (Left); Gastroc recession extremity (Left, 09/16/2014); Calcaneal osteotomy (Left, 09/16/2014); Nevus excision (Right, 11/19/2019); and Lesion removal (Right, 11/19/2019). Family History:  Her family history includes Breast cancer in her maternal grandfather and another family member; Colon cancer (age of onset: 26) in her father; Colon cancer (age of onset: 54) in her sister; Coronary artery disease in her father; Diabetes in her paternal grandfather; Heart attack in her father; Rheum arthritis in her mother.  REVIEW OF SYSTEMS  : All other systems reviewed and negative except where noted in the History of Present Illness.  PHYSICAL EXAM: BP 114/82   Pulse (!) 55   Ht 5\' 3"  (1.6 m)   Wt 183 lb (83 kg)   SpO2 99%   BMI 32.42 kg/m  General Appearance: Well nourished, in no apparent distress. Head:   Normocephalic and atraumatic. Eyes:  sclerae anicteric,conjunctive pink  Respiratory: Respiratory effort normal, BS equal bilaterally without rales, rhonchi, wheezing. Cardio: RRR with no MRGs. Peripheral pulses intact.  Abdomen: Soft,  Obese ,active bowel sounds. No tenderness . No masses. Rectal: Not evaluated Musculoskeletal:  Full ROM, Ambulates with walker gait. Without edema. Skin:  Dry and intact without significant lesions or rashes Neuro: Alert and  oriented x4;  No focal deficits. Psych:  Cooperative. Normal mood and affect.    Doree Albee, PA-C 11:45 AM

## 2023-05-09 ENCOUNTER — Telehealth: Payer: Self-pay | Admitting: Physician Assistant

## 2023-05-09 NOTE — Telephone Encounter (Signed)
Line rings no answer no voice mail  °

## 2023-05-09 NOTE — Telephone Encounter (Signed)
 PT is calling to speak with a nurse about Xifaxin. She said her diarrhea is getting worse and more frequent. She only has 2 days worth of the medication left and she would like to also discuss having colonoscopy sooner. Please advise.

## 2023-05-10 NOTE — Telephone Encounter (Signed)
 Pt has been communicating via mychart. Refer to pt message 05/09/23.

## 2023-05-21 NOTE — Telephone Encounter (Signed)
 Inbound call from patient, would like to discuss diarrhea and Xifaxin with a nurse. Did not wish to disclose any further information.

## 2023-06-03 ENCOUNTER — Encounter: Payer: Self-pay | Admitting: Certified Registered Nurse Anesthetist

## 2023-06-05 NOTE — Progress Notes (Unsigned)
 [facility] History and Physical   Primary Care Physician:  System, Provider Not In   Reason for Procedure:   diarrhea  Plan:    colonoscopy     HPI: Courtney Lawrence is a 83 y.o. female w/ months of diarrhea, typically watery. Seen in Jan and Feb by PA Colier. Fecal elastase low, Creon did not help. Xifaxan tx resulted in softer but not normal stools.  She does have a FHx colon cancer Last colonoscopy 2020 w/o polyps  Past Medical History:  Diagnosis Date   Allergy    Anxiety    Diverticulosis 10/28/2008   DJD (degenerative joint disease)    Hyperlipidemia    Migraine    Osteopenia    Personal history of colonic polyp - adenoma 10/30/2013   PONV (postoperative nausea and vomiting)    Posterior tibial tendonitis    left   Seasonal allergies    Urticaria, chronic     Past Surgical History:  Procedure Laterality Date   CALCANEAL OSTEOTOMY Left 09/16/2014   Procedure: CALCANEAL OSTEOTOMY;  Surgeon: Toni Arthurs, MD;  Location: [facility];  Service: Orthopedics;  Laterality: Left;   COLONOSCOPY  10/28/2008   Dr. Gaylyn Lambert   FOOT SURGERY Right 2012 ALSO 2003   GASTROC RECESSION EXTREMITY Left 09/16/2014   Procedure: LEFT GASTROCNEMIUS RECESSION ;  Surgeon: Toni Arthurs, MD;  Location: [facility];  Service: Orthopedics;  Laterality: Left;   JOINT REPLACEMENT Left    knee   LESION REMOVAL Right 11/19/2019   Procedure: Excision of right arm dysplasia;  Surgeon: Peggye Form, DO;  Location: [facility];  Service: Plastics;  Laterality: Right;   NEVUS EXCISION Right 11/19/2019   Procedure: Excision of right foot melanoma;  Surgeon: Peggye Form, DO;  Location: [facility];  Service: Plastics;  Laterality: Right;   TOTAL KNEE ARTHROPLASTY  05/14/2011   Procedure: TOTAL KNEE ARTHROPLASTY;  Surgeon: Shelda Pal, MD;  Location: [facility];  Service: Orthopedics;  Laterality: Left;    Prior to  Admission medications   Medication Sig Start Date End Date Taking? Authorizing Provider  CALCIUM-MAGNESIUM-VITAMIN D PO Take 1 tablet by mouth 2 (two) times daily.    [provider]  celecoxib (CELEBREX) 100 MG capsule Take 100 mg by mouth daily.    [provider]  Cholecalciferol (VITAMIN D-3) 5000 UNITS TABS Take by mouth daily.     [provider]  Cyanocobalamin (VITAMIN B-12) 1000 MCG SUBL Place under the tongue daily.    [provider]  DULoxetine (CYMBALTA) 30 MG capsule Take 30 mg by mouth daily. 02/25/23   [provider]  gabapentin (NEURONTIN) 100 MG capsule Take 100 mg by mouth 2 (two) times daily. Patient taking differently: Take 100 mg by mouth as needed. 02/08/23   [provider]  glucosamine-chondroitin 500-400 MG tablet Take 1 tablet by mouth daily.     [provider]  lipase/protease/amylase (CREON) 36000 UNITS CPEP capsule 3 capsules before meals, 2 capsule before snacks 04/04/23   Unk Lightning, PA  loperamide (IMODIUM) 2 MG capsule Take 1 capsule (2 mg total) by mouth daily. 04/04/23   Unk Lightning, PA  Multiple Vitamins-Minerals (ICAPS PO) Take 1 tablet by mouth daily.    [provider]  naratriptan (AMERGE) 2.5 MG tablet Take 2.5 mg by mouth as needed for migraine. Take one (1) tablet at onset of headache; if returns or does not resolve, may repeat  after 4 hours; do not exceed five (5) mg in 24 hours.    [provider]  ondansetron (ZOFRAN-ODT) 4 MG disintegrating tablet Take 1 tablet (4 mg total) by mouth every 8 (eight) hours as needed for nausea or vomiting. 12/22/21   Rancour, Jeannett Senior, MD  promethazine (PHENERGAN) 12.5 MG tablet Take 1 tablet (12.5 mg total) by mouth every 6 (six) hours as needed for nausea or vomiting. 10/07/20   Glyn Ade, Scot Jun, PA-C  rizatriptan (MAXALT) 10 MG tablet Take 1 tablet (10 mg total) by mouth as needed for migraine. May repeat in 2 hours  if needed 10/07/20   Glyn Ade, Scot Jun, PA-C  rosuvastatin (CRESTOR) 5 MG tablet Take 5 mg by mouth daily. Patient not taking: Reported on 04/24/2023    [provider]  tiZANidine (ZANAFLEX) 2 MG tablet Take 2 mg by mouth at bedtime. Patient not taking: Reported on 04/24/2023 01/16/23   [provider]  Wheat Dextrin (BENEFIBER) POWD One tablespoon daily 03/07/23   Doree Albee, PA-C    Current Outpatient Medications  Medication Sig Dispense Refill   CALCIUM-MAGNESIUM-VITAMIN D PO Take 1 tablet by mouth 2 (two) times daily.     celecoxib (CELEBREX) 100 MG capsule Take 100 mg by mouth daily.     Cholecalciferol (VITAMIN D-3) 5000 UNITS TABS Take by mouth daily.      Cyanocobalamin (VITAMIN B-12) 1000 MCG SUBL Place under the tongue daily.     DULoxetine (CYMBALTA) 30 MG capsule Take 30 mg by mouth daily.     glucosamine-chondroitin 500-400 MG tablet Take 1 tablet by mouth daily.      Multiple Vitamins-Minerals (ICAPS PO) Take 1 tablet by mouth daily.     gabapentin (NEURONTIN) 100 MG capsule Take 100 mg by mouth 2 (two) times daily. (Patient taking differently: Take 100 mg by mouth as needed.)     loperamide (IMODIUM) 2 MG capsule Take 1 capsule (2 mg total) by mouth daily. 90 capsule 0   naratriptan (AMERGE) 2.5 MG tablet Take 2.5 mg by mouth as needed for migraine. Take one (1) tablet at onset of headache; if returns or does not resolve, may repeat after 4 hours; do not exceed five (5) mg in 24 hours.     ondansetron (ZOFRAN-ODT) 4 MG disintegrating tablet Take 1 tablet (4 mg total) by mouth every 8 (eight) hours as needed for nausea or vomiting. 20 tablet 0   promethazine (PHENERGAN) 12.5 MG tablet Take 1 tablet (12.5 mg total) by mouth every 6 (six) hours as needed for nausea or vomiting. 30 tablet 0   rizatriptan (MAXALT) 10 MG tablet Take 1 tablet (10 mg total) by mouth as needed for migraine. May repeat in 2 hours if needed 10 tablet 2   rosuvastatin (CRESTOR) 5 MG  tablet Take 5 mg by mouth daily. (Patient not taking: Reported on 06/06/2023)     tiZANidine (ZANAFLEX) 2 MG tablet Take 2 mg by mouth at bedtime. (Patient not taking: Reported on 04/02/2023)     Wheat Dextrin (BENEFIBER) POWD One tablespoon daily 500 g 0   Current Facility-Administered Medications  Medication Dose Route Frequency Provider Last Rate Last Admin   0.9 %  sodium chloride infusion  500 mL Intravenous Continuous Iva Boop, MD        Allergies as of 06/06/2023 - Review Complete 06/06/2023  Allergen Reaction Noted   Platinum-containing compounds  05/14/2011   Codeine Nausea And Vomiting 06/08/2013   Yellow dye Other (See  Comments) 06/06/2023   Aspirin Hives and Other (See Comments) 05/03/2011   Nickel Rash 05/04/2011   Penicillins  06/08/2013   Yellow dyes (non-tartrazine) Hives 05/03/2011    Family History  Problem Relation Age of Onset   Colon cancer Father 83   Heart attack Father    Coronary artery disease Father    Colon cancer Sister 30   Rheum arthritis Mother    Diabetes Paternal Grandfather    Breast cancer Maternal Grandfather    Breast cancer Other    Esophageal cancer Neg Hx    Stomach cancer Neg Hx    Rectal cancer Neg Hx     Social History   Socioeconomic History   Marital status: Widowed    Spouse name: Not on file   Number of children: 3   Years of education: Not on file   Highest education level: Not on file  Occupational History   Occupation: president [employer]   Occupation: Retired  Tobacco Use   Smoking status: Never   Smokeless tobacco: Never  Vaping Use   Vaping status: Never Used  Substance and Sexual Activity   Alcohol use: Never    Alcohol/week: 1.0 standard drink of alcohol    Types: 1 Standard drinks or equivalent per week   Drug use: No   Sexual activity: Not on file  Other Topics Concern   Not on file  Social History Narrative   Married, she is retired Education officer, environmental. One son two daughters Lennox Laity Dvsokin is  one)   . 2-3 caffeinated drinks a day.   Social Drivers of Corporate investment banker Strain: Not on file  Food Insecurity: Not on file  Transportation Needs: Not on file  Physical Activity: Not on file  Stress: Not on file  Social Connections: Not on file  Intimate Partner Violence: Not on file    Review of Systems:  All other review of systems negative except as mentioned in the HPI.  Physical Exam: Vital signs BP 104/74   Pulse 99   Temp 97.7 F (36.5 C)   Ht 5\' 3"  (1.6 m)   Wt 183 lb (83 kg)   SpO2 94%   BMI 32.42 kg/m   General:   Alert,  Well-developed, well-nourished, pleasant and cooperative in NAD Lungs:  Clear throughout to auscultation.   Heart:  Regular rate and rhythm; no murmurs, clicks, rubs,  or gallops. Abdomen:  Soft, nontender and nondistended. Normal bowel sounds.   Neuro/Psych:  Alert and cooperative. Normal mood and affect. A and O x 3   @Jonique Kulig  Sena Slate, MD, [facility] [phone] (pager) 06/06/2023 10:39 AM@

## 2023-06-06 ENCOUNTER — Ambulatory Visit: Payer: Medicare Other | Admitting: Internal Medicine

## 2023-06-06 ENCOUNTER — Encounter: Payer: Self-pay | Admitting: Internal Medicine

## 2023-06-06 VITALS — BP 114/75 | HR 80 | Temp 97.7°F | Resp 17 | Ht 63.0 in | Wt 183.0 lb

## 2023-06-06 DIAGNOSIS — K529 Noninfective gastroenteritis and colitis, unspecified: Secondary | ICD-10-CM | POA: Diagnosis not present

## 2023-06-06 DIAGNOSIS — K648 Other hemorrhoids: Secondary | ICD-10-CM | POA: Diagnosis not present

## 2023-06-06 DIAGNOSIS — K5730 Diverticulosis of large intestine without perforation or abscess without bleeding: Secondary | ICD-10-CM | POA: Diagnosis not present

## 2023-06-06 DIAGNOSIS — R197 Diarrhea, unspecified: Secondary | ICD-10-CM

## 2023-06-06 DIAGNOSIS — K52832 Lymphocytic colitis: Secondary | ICD-10-CM | POA: Diagnosis not present

## 2023-06-06 MED ORDER — SODIUM CHLORIDE 0.9 % IV SOLN: 500.0000 mL | INTRAVENOUS | Status: DC

## 2023-06-06 NOTE — Progress Notes (Signed)
 Report given to PACU, vss

## 2023-06-06 NOTE — Progress Notes (Signed)
 Called to room to assist during endoscopic procedure.  Patient ID and intended procedure confirmed with present staff. Received instructions for my participation in the procedure from the performing physician.

## 2023-06-06 NOTE — Progress Notes (Signed)
 Pt's states no medical or surgical changes since previsit or office visit.

## 2023-06-06 NOTE — Op Note (Signed)
 [facility] Patient Name: Courtney Lawrence Procedure Date: 06/06/2023 10:39 AM MRN: [id number] Endoscopist: Iva Boop , MD, [id number] Age: 83 Referring MD:  Date of Birth: 1940-08-21 Gender: Female Account #: [id number] Procedure:                Colonoscopy Indications:              Clinically significant diarrhea of unexplained                            origin - has notr responded to Creon (low elastase                            likely dilutional effect), or Xifaxan. No infection. Medicines:                Monitored Anesthesia Care Procedure:                Pre-Anesthesia Assessment:                           - Prior to the procedure, a History and Physical                            was performed, and patient medications and                            allergies were reviewed. The patient's tolerance of                            previous anesthesia was also reviewed. The risks                            and benefits of the procedure and the sedation                            options and risks were discussed with the patient.                            All questions were answered, and informed consent                            was obtained. Prior Anticoagulants: The patient has                            taken no anticoagulant or antiplatelet agents. ASA                            Grade Assessment: II - A patient with mild systemic                            disease. After reviewing the risks and benefits,                            the patient was deemed in satisfactory condition to  undergo the procedure.                           After obtaining informed consent, the colonoscope                            was passed under direct vision. Throughout the                            procedure, the patient's blood pressure, pulse, and                            oxygen saturations were monitored continuously. The                            CF  [id number] #[id number] was introduced through the anus                            and advanced to the the cecum, identified by                            appendiceal orifice and ileocecal valve. The                            colonoscopy was somewhat difficult due to                            significant looping. Successful completion of the                            procedure was aided by applying abdominal pressure.                            The patient tolerated the procedure well. The                            quality of the bowel preparation was good. The                            ileocecal valve, appendiceal orifice, and rectum                            were photographed. The bowel preparation used was                            SUFLAVE via split dose instruction. Scope In: 10:51:42 AM Scope Out: 11:07:55 AM Scope Withdrawal Time: 0 hours 9 minutes 37 seconds  Total Procedure Duration: 0 hours 16 minutes 13 seconds  Findings:                 The perianal and digital rectal examinations were                            normal.  Multiple diverticula were found in the sigmoid                            colon.                           Internal hemorrhoids were found.                           The exam was otherwise without abnormality on                            direct and retroflexion views.                           Biopsies for histology were taken with a cold                            forceps from the entire colon for evaluation of                            microscopic colitis. Complications:            No immediate complications. Estimated Blood Loss:     Estimated blood loss was minimal. Impression:               - Diverticulosis in the sigmoid colon.                           - Internal hemorrhoids.                           - The examination was otherwise normal on direct                            and retroflexion views.                            - Biopsies were taken with a cold forceps from the                            entire colon for evaluation of microscopic colitis. Recommendation:           - Patient has a contact number available for                            emergencies. The signs and symptoms of potential                            delayed complications were discussed with the                            patient. Return to normal activities tomorrow.                            Written discharge instructions were provided to the  patient.                           - Resume previous diet.                           - Continue present medications. Imodium AD                           - Await pathology results.                           - No repeat colonoscopy due to age. Iva Boop, MD 06/06/2023 11:15:09 AM This report has been signed electronically.

## 2023-06-06 NOTE — Patient Instructions (Addendum)
 I did not see any problems - I took the biopsies.  You do have diverticulosis and hemorrhoids which we knew.  I will contact with results and plans.  Continue Imodium AD  I appreciate the opportunity to care for you. Iva Boop, MD, [facility]  -Handout on diverticulosis and hemorrhoids provided -await pathology results -no repeat colonoscopy due to age  -Continue present medications  -Continue Imodium   YOU HAD AN ENDOSCOPIC PROCEDURE TODAY AT THE [facility]:   Refer to the procedure report that was given to you for any specific questions about what was found during the examination.  If the procedure report does not answer your questions, please call your gastroenterologist to clarify.  If you requested that your care partner not be given the details of your procedure findings, then the procedure report has been included in a sealed envelope for you to review at your convenience later.  YOU SHOULD EXPECT: Some feelings of bloating in the abdomen. Passage of more gas than usual.  Walking can help get rid of the air that was put into your GI tract during the procedure and reduce the bloating. If you had a lower endoscopy (such as a colonoscopy or flexible sigmoidoscopy) you may notice spotting of blood in your stool or on the toilet paper. If you underwent a bowel prep for your procedure, you may not have a normal bowel movement for a few days.  Please Note:  You might notice some irritation and congestion in your nose or some drainage.  This is from the oxygen used during your procedure.  There is no need for concern and it should clear up in a day or so.  SYMPTOMS TO REPORT IMMEDIATELY:  Following lower endoscopy (colonoscopy or flexible sigmoidoscopy):  Excessive amounts of blood in the stool  Significant tenderness or worsening of abdominal pains  Swelling of the abdomen that is new, acute  Fever of 100F or higher  For urgent or emergent issues, a gastroenterologist  can be reached at any hour by calling [phone]. Do not use MyChart messaging for urgent concerns.    DIET:  We do recommend a small meal at first, but then you may proceed to your regular diet.  Drink plenty of fluids but you should avoid alcoholic beverages for 24 hours.  ACTIVITY:  You should plan to take it easy for the rest of today and you should NOT DRIVE or use heavy machinery until tomorrow (because of the sedation medicines used during the test).    FOLLOW UP: Our staff will call the number listed on your records the next business day following your procedure.  We will call around 7:15- 8:00 am to check on you and address any questions or concerns that you may have regarding the information given to you following your procedure. If we do not reach you, we will leave a message.     If any biopsies were taken you will be contacted by phone or by letter within the next 1-3 weeks.  Please call us at [phone] if you have not heard about the biopsies in 3 weeks.    SIGNATURES/CONFIDENTIALITY: You and/or your care partner have signed paperwork which will be entered into your electronic medical record.  These signatures attest to the fact that that the information above on your After Visit Summary has been reviewed and is understood.  Full responsibility of the confidentiality of this discharge information lies with you and/or your care-partner.

## 2023-06-07 ENCOUNTER — Telehealth: Payer: Self-pay | Admitting: *Deleted

## 2023-06-07 NOTE — Telephone Encounter (Signed)
  Follow up Call-     06/06/2023   10:02 AM  Call back number  Post procedure Call Back phone  # [phone]  Permission to leave phone message Yes     Patient questions:  Do you have a fever, pain , or abdominal swelling? No. Pain Score  0 *  Have you tolerated food without any problems? Yes.    Have you been able to return to your normal activities? Yes.    Do you have any questions about your discharge instructions: Diet   No. Medications  No. Follow up visit  No.  Do you have questions or concerns about your Care? No.  Actions: * If pain score is 4 or above: No action needed, pain <4.

## 2023-06-10 LAB — SURGICAL PATHOLOGY

## 2023-06-12 ENCOUNTER — Encounter: Payer: Self-pay | Admitting: Internal Medicine

## 2023-06-13 ENCOUNTER — Encounter: Payer: Self-pay | Admitting: Internal Medicine

## 2023-06-13 ENCOUNTER — Telehealth: Payer: Self-pay | Admitting: Internal Medicine

## 2023-06-13 DIAGNOSIS — K52832 Lymphocytic colitis: Secondary | ICD-10-CM | POA: Insufficient documentation

## 2023-06-13 NOTE — Telephone Encounter (Signed)
 Please call the patient and explain I found a microscopic colitis called lymphocytic colitis  We typically treat this with a drug called budesonide and I pended a prescription.  If she is still having diarrhea I would like her to take this - usually for 2 months.  However there is an allergy flag about yellow dyes which could be in the budesonide - but not sure how significant an issue that has been for her.  So if we could clarify and she is comfortable taking the medication she should do so  F/u me 6-8 weeks can use RN visit

## 2023-06-13 NOTE — Telephone Encounter (Signed)
 Patient returning call.

## 2023-06-13 NOTE — Telephone Encounter (Signed)
 Left message on machine to call back   Follow up appt has been made for 07/25/23 at 850 am with Dr Leone Payor.

## 2023-06-14 MED ORDER — BUDESONIDE 3 MG PO CPEP: 9.0000 mg | ORAL_CAPSULE | Freq: Every day | ORAL | 1 refills | Status: DC

## 2023-06-14 NOTE — Telephone Encounter (Signed)
 See alternate MyChart message for further communication

## 2023-06-14 NOTE — Telephone Encounter (Signed)
 Left message on machine to call back   I have also sent a message to My Chart- the pt does view her messages

## 2023-06-16 ENCOUNTER — Encounter: Payer: Self-pay | Admitting: Internal Medicine

## 2023-07-16 ENCOUNTER — Telehealth: Payer: Self-pay | Admitting: Internal Medicine

## 2023-07-16 NOTE — Telephone Encounter (Signed)
Records have been faxed per request.

## 2023-07-16 NOTE — Telephone Encounter (Signed)
 [facility] is calling to have pathology report and office notes from last procedure/office visit 06/06/2023. Please send to [phone]

## 2023-07-25 ENCOUNTER — Ambulatory Visit (INDEPENDENT_AMBULATORY_CARE_PROVIDER_SITE_OTHER): Admitting: Internal Medicine

## 2023-07-25 ENCOUNTER — Encounter: Payer: Self-pay | Admitting: Internal Medicine

## 2023-07-25 VITALS — BP 100/70 | Ht 62.25 in | Wt 180.4 lb

## 2023-07-25 DIAGNOSIS — K52832 Lymphocytic colitis: Secondary | ICD-10-CM | POA: Diagnosis not present

## 2023-07-25 MED ORDER — BUDESONIDE 3 MG PO CPEP: 9.0000 mg | ORAL_CAPSULE | Freq: Every day | ORAL | 0 refills | Status: DC

## 2023-07-25 NOTE — Progress Notes (Signed)
 Courtney Lawrence 83 y.o. [birth year] [id number]  Assessment & Plan:   Encounter Diagnosis  Name Primary?   Lymphocytic colitis Yes     Diagnosed via colonoscopy in April 2025. Symptoms resolved with budesonide .  Celecoxib and duloxetine are potential triggers but discontinuation not advised due to pain management benefits. Disease may resolve, recur episodically, or require chronic treatment. - Continue budesonide  for two months total, then discontinue. - Monitor for recurrence of diarrhea after discontinuation. - If diarrhea recurs, contact office for further management. - Re-prescribe budesonide  for emergencies, especially when traveling. - Avoid excessive use of artificial sweeteners   I have explained that there are 3 patterns of this disease in my experience initial treatment leads to long-term remission, episodic need for treatment or chronic treatment.  Budesonide  is a steroid and there are bone density issues possible.  However we are trying to manage quality of life as best we can and she understands the risks benefit ratio.  Meds ordered this encounter  Medications   budesonide  (ENTOCORT EC ) 3 MG 24 hr capsule    Sig: Take 3 capsules (9 mg total) by mouth daily.    Dispense:  90 capsule    Refill:  0    Hold until patient calls for refill.     CC: Courtney Luz, MD  Subjective:  Patient consented to the use of artificial intelligence scribe Chief Complaint: Follow-up of lymphocytic colitis  HPI Courtney Lawrence is an 83 year old female with lymphocytic colitis who presents for follow-up of her condition. She is accompanied by her daughter, Courtney Lawrence.  Lymphocytic colitis was diagnosed following a colonoscopy on June 06, 2023. Chronic diarrhea resolved with the initiation of budesonide  in April. She is currently taking three capsules of budesonide  daily, and diarrhea has stopped since starting the medication.  Colonoscopy findings included sigmoid diverticulosis and  internal hemorrhoids. Random biopsies confirmed lymphocytic colitis. Prior to the colonoscopy, low fecal elastase was noted, and a trial of Creon  was unhelpful.  GI pathogen panel with C. difficile testing was negative.  I concluded that the fecal elastase level was low due to watery diarrhea and dilution effect.  She has been taking celecoxib chronically and is concerned it might be a trigger for her condition. Her weight has been stable since February after an initial weight loss when diarrhea began.  Wt Readings from Last 3 Encounters:  07/25/23 180 lb 6 oz (81.8 kg)  06/06/23 183 lb (83 kg)  04/24/23 183 lb (83 kg)     Allergies  Allergen Reactions   Platinum-Containing Compounds     swelling   Codeine Nausea And Vomiting   Yellow Dye Other (See Comments)    hives   Clindamycin Hcl Hives    Other Reaction(s): headache   Aspirin Hives and Other (See Comments)    Headache    Nickel Rash    GETS "INFECTION" WITH NICKEL EARRINGS  nickel sulfate  nickel   Penicillins     Per pt: family hx allergy to PCN   Sulfamethoxazole -Trimethoprim  Rash   Yellow Dyes (Non-Tartrazine) Hives   Current Meds  Medication Sig   budesonide  (ENTOCORT EC ) 3 MG 24 hr capsule Take 3 capsules (9 mg total) by mouth daily.   CALCIUM-MAGNESIUM-VITAMIN D PO Take 1 tablet by mouth 2 (two) times daily.   celecoxib (CELEBREX) 100 MG capsule Take 100 mg by mouth daily.   Cholecalciferol (VITAMIN D-3) 5000 UNITS TABS Take by mouth daily.    Cyanocobalamin  (VITAMIN B-12)  1000 MCG SUBL Place under the tongue daily.   DULoxetine (CYMBALTA) 30 MG capsule Take 30 mg by mouth daily.   glucosamine-chondroitin 500-400 MG tablet Take 1 tablet by mouth daily.    Multiple Vitamins-Minerals (ICAPS PO) Take 1 tablet by mouth daily.   rizatriptan  (MAXALT ) 10 MG tablet Take 1 tablet (10 mg total) by mouth as needed for migraine. May repeat in 2 hours if needed   rosuvastatin (CRESTOR) 5 MG tablet Take 5 mg by mouth daily.    tiZANidine (ZANAFLEX) 2 MG tablet Take 2 mg by mouth at bedtime.   Past Medical History:  Diagnosis Date   Allergy    Anxiety    Diverticulosis 10/28/2008   DJD (degenerative joint disease)    Hyperlipidemia    Lymphocytic colitis    Migraine    Osteopenia    Personal history of colonic polyp - adenoma 10/30/2013   PONV (postoperative nausea and vomiting)    Posterior tibial tendonitis    left   Seasonal allergies    Urticaria, chronic    Past Surgical History:  Procedure Laterality Date   CALCANEAL OSTEOTOMY Left 09/16/2014   Procedure: CALCANEAL OSTEOTOMY;  Surgeon: Amada Backer, MD;  Location: [facility];  Service: Orthopedics;  Laterality: Left;   COLONOSCOPY  10/28/2008   Dr. Jani Meeker   FOOT SURGERY Right 2012 ALSO 2003   GASTROC RECESSION EXTREMITY Left 09/16/2014   Procedure: LEFT GASTROCNEMIUS RECESSION ;  Surgeon: Amada Backer, MD;  Location: [facility];  Service: Orthopedics;  Laterality: Left;   JOINT REPLACEMENT Left    knee   LESION REMOVAL Right 11/19/2019   Procedure: Excision of right arm dysplasia;  Surgeon: Thornell Flirt, DO;  Location: [facility];  Service: Plastics;  Laterality: Right;   NEVUS EXCISION Right 11/19/2019   Procedure: Excision of right foot melanoma;  Surgeon: Thornell Flirt, DO;  Location: [facility];  Service: Plastics;  Laterality: Right;   TOTAL KNEE ARTHROPLASTY  05/14/2011   Procedure: TOTAL KNEE ARTHROPLASTY;  Surgeon: Bevin Bucks, MD;  Location: [facility];  Service: Orthopedics;  Laterality: Left;   Social History   Social History Narrative   Married, she is retired Education officer, environmental. One son two daughters (Courtney Lawrence is one)   . 2-3 caffeinated drinks a day.   family history includes Breast cancer in her maternal grandfather and another family member; Colon cancer (age of onset: 2) in her father; Colon cancer (age of onset: 70) in her sister; Coronary  artery disease in her father; Diabetes in her paternal grandfather; Heart attack in her father; Rheum arthritis in her mother.   Review of Systems As per HPI  Objective:   Physical Exam BP 100/70 (BP Location: Left Arm, Patient Position: Sitting, Cuff Size: Normal)   Ht 5' 2.25" (1.581 m) Comment: height measured without shoes  Wt 180 lb 6 oz (81.8 kg)   BMI 32.73 kg/m  Pleasant elderly white woman no acute distress.

## 2023-07-25 NOTE — Patient Instructions (Signed)
 Finish your current bottle of budesonide  and then stop it. We sent a refill and told the pharmacy to hold it until you call for it.  Call us  back if the diarrhea returns.  _______________________________________________________  If your blood pressure at your visit was 140/90 or greater, please contact your primary care physician to follow up on this.  _______________________________________________________  If you are age 82 or older, your body mass index should be between 23-30. Your Body mass index is 32.73 kg/m. If this is out of the aforementioned range listed, please consider follow up with your Primary Care Provider.  If you are age 25 or younger, your body mass index should be between 19-25. Your Body mass index is 32.73 kg/m. If this is out of the aformentioned range listed, please consider follow up with your Primary Care Provider.   ________________________________________________________  The  GI providers would like to encourage you to use MYCHART to communicate with providers for non-urgent requests or questions.  Due to long hold times on the telephone, sending your provider a message by [facility] may be a faster and more efficient way to get a response.  Please allow 48 business hours for a response.  Please remember that this is for non-urgent requests.  _______________________________________________________  I appreciate the opportunity to care for you. Efren Grapes, [facility]

## 2023-10-09 ENCOUNTER — Encounter: Payer: Self-pay | Admitting: Internal Medicine

## 2023-10-25 ENCOUNTER — Other Ambulatory Visit: Payer: Self-pay | Admitting: Internal Medicine

## 2023-12-02 ENCOUNTER — Other Ambulatory Visit: Payer: Self-pay | Admitting: Internal Medicine

## 2023-12-05 ENCOUNTER — Telehealth: Payer: Self-pay

## 2023-12-05 NOTE — Telephone Encounter (Signed)
 I have left her messages on both numbers. I will try to reach her again tomorrow.

## 2023-12-05 NOTE — Telephone Encounter (Signed)
 Patient called in saying she needed her budesonide  refilled. I looked back on the last office note from May 2025 and it said to finish her current bottle and stop it. She was to call us  back if the diarrhea came back. I have left her a detailed message on both of her #'s to call me back.

## 2023-12-05 NOTE — Telephone Encounter (Signed)
 Spoke with patient and she advised she is no longer having the diarrhea. Please advise.   Patient will be leaving and will be back around 3:30 pm.

## 2023-12-06 MED ORDER — BUDESONIDE 3 MG PO CPEP: 3.0000 mg | ORAL_CAPSULE | Freq: Every day | ORAL | 3 refills | Status: AC

## 2023-12-06 NOTE — Telephone Encounter (Signed)
 She should stay on one a day since she had a re;apse and needed to restart - and since not having diarrhea on this dose.  I sent in a refill

## 2023-12-06 NOTE — Telephone Encounter (Signed)
 I spoke with Courtney Lawrence and she told me she had a MYCHART message back on 10/09/2023 with us  and Dr Avram adjusted her budesonide  dosage. She has just started one a day. She is not having diarrhea just lots of gas. She wants to know how long does she stay on one daily. She last saw us  in May 2025. Will route to Dr Avram and I told her I will call her back. She does need a refill. She said it is okay for me to leave a message if I don't get her on the phone.

## 2023-12-06 NOTE — Telephone Encounter (Signed)
 I left Vernelle a detailed message with the plan and told her to call back with any questions.

## 2024-01-17 LAB — LAB REPORT - SCANNED
EGFR: 56
TSH: 2.99 (ref 0.41–5.90)

## 2024-03-02 ENCOUNTER — Telehealth: Payer: Self-pay | Admitting: Internal Medicine

## 2024-03-02 NOTE — Telephone Encounter (Signed)
 Patient reports 2 to 3 weeks of watery diarrhea about 3 times a day. She has been completely off budesonide  since end of October 2025. She does not recall that the plan was to maintain on budesonide  daily.

## 2024-03-02 NOTE — Telephone Encounter (Signed)
 Inbound call from patient stating diarrhea has returned. States she has finished budesonide  about a month ago. Requesting a call to discuss next steps. Please advise, thank you

## 2024-03-02 NOTE — Telephone Encounter (Signed)
Patient agrees to this plan of care.  

## 2024-03-03 ENCOUNTER — Other Ambulatory Visit (HOSPITAL_BASED_OUTPATIENT_CLINIC_OR_DEPARTMENT_OTHER): Payer: Self-pay | Admitting: Family Medicine

## 2024-03-03 DIAGNOSIS — Z1231 Encounter for screening mammogram for malignant neoplasm of breast: Secondary | ICD-10-CM

## 2024-03-12 NOTE — Telephone Encounter (Signed)
 Patient reports improvement. She will continue to take budesonide  3 mg daily. Patient is advised and agrees to this plan of care. No further questions at this time.

## 2024-03-16 ENCOUNTER — Other Ambulatory Visit: Payer: Self-pay

## 2024-03-16 DIAGNOSIS — Z85820 Personal history of malignant melanoma of skin: Secondary | ICD-10-CM | POA: Insufficient documentation

## 2024-03-16 DIAGNOSIS — F419 Anxiety disorder, unspecified: Secondary | ICD-10-CM | POA: Insufficient documentation

## 2024-03-16 DIAGNOSIS — M5416 Radiculopathy, lumbar region: Secondary | ICD-10-CM | POA: Insufficient documentation

## 2024-03-16 DIAGNOSIS — K760 Fatty (change of) liver, not elsewhere classified: Secondary | ICD-10-CM | POA: Insufficient documentation

## 2024-03-16 DIAGNOSIS — M8580 Other specified disorders of bone density and structure, unspecified site: Secondary | ICD-10-CM | POA: Insufficient documentation

## 2024-03-16 DIAGNOSIS — M5430 Sciatica, unspecified side: Secondary | ICD-10-CM | POA: Insufficient documentation

## 2024-03-16 DIAGNOSIS — E785 Hyperlipidemia, unspecified: Secondary | ICD-10-CM | POA: Insufficient documentation

## 2024-03-16 DIAGNOSIS — G43909 Migraine, unspecified, not intractable, without status migrainosus: Secondary | ICD-10-CM | POA: Insufficient documentation

## 2024-03-16 DIAGNOSIS — R112 Nausea with vomiting, unspecified: Secondary | ICD-10-CM | POA: Insufficient documentation

## 2024-03-16 DIAGNOSIS — T7840XA Allergy, unspecified, initial encounter: Secondary | ICD-10-CM | POA: Insufficient documentation

## 2024-03-16 DIAGNOSIS — M1990 Unspecified osteoarthritis, unspecified site: Secondary | ICD-10-CM | POA: Insufficient documentation

## 2024-03-16 DIAGNOSIS — M76829 Posterior tibial tendinitis, unspecified leg: Secondary | ICD-10-CM | POA: Insufficient documentation

## 2024-03-16 DIAGNOSIS — J302 Other seasonal allergic rhinitis: Secondary | ICD-10-CM | POA: Insufficient documentation

## 2024-03-16 DIAGNOSIS — D039 Melanoma in situ, unspecified: Secondary | ICD-10-CM | POA: Insufficient documentation

## 2024-03-16 DIAGNOSIS — H9190 Unspecified hearing loss, unspecified ear: Secondary | ICD-10-CM | POA: Insufficient documentation

## 2024-03-16 DIAGNOSIS — L508 Other urticaria: Secondary | ICD-10-CM | POA: Insufficient documentation

## 2024-03-16 DIAGNOSIS — R269 Unspecified abnormalities of gait and mobility: Secondary | ICD-10-CM | POA: Insufficient documentation

## 2024-03-16 DIAGNOSIS — R7989 Other specified abnormal findings of blood chemistry: Secondary | ICD-10-CM | POA: Insufficient documentation

## 2024-03-16 DIAGNOSIS — M130 Polyarthritis, unspecified: Secondary | ICD-10-CM | POA: Insufficient documentation

## 2024-03-16 DIAGNOSIS — G43009 Migraine without aura, not intractable, without status migrainosus: Secondary | ICD-10-CM | POA: Insufficient documentation

## 2024-03-16 DIAGNOSIS — E669 Obesity, unspecified: Secondary | ICD-10-CM | POA: Insufficient documentation

## 2024-03-16 DIAGNOSIS — D0359 Melanoma in situ of other part of trunk: Secondary | ICD-10-CM | POA: Insufficient documentation

## 2024-03-25 ENCOUNTER — Encounter: Payer: Self-pay | Admitting: Cardiology

## 2024-03-25 DIAGNOSIS — R0602 Shortness of breath: Secondary | ICD-10-CM | POA: Insufficient documentation

## 2024-03-25 NOTE — Progress Notes (Unsigned)
 "    Cardiology Office Note   Date:  03/26/2024   ID:  Courtney, Lawrence 11-May-1940, MRN [id number]  PCP:  Courtney Devoria LABOR, MD  Cardiologist:   None Referring:  Courtney Devoria LABOR, MD  Chief Complaint  Patient presents with   Palpitations      History of Present Illness: Courtney Lawrence is a 84 y.o. female who presents for evaluation of palpitations.  She has no significant past cardiac history.  She lives at [facility].  She does water aerobics once a week there.  She walks to the dining hall.  She denies any cardiovascular symptoms with this.  She has not had any chest pressure, neck or arm discomfort.  She has not had any shortness of breath, PND or orthopnea.  She has had no presyncope or syncope.  She had 1 episode of tachypalpitations.  She was vacuuming.  This was a couple of months ago.  Her heart went fast and she went to lie down.  She said it spontaneously resolved.  She had not had the symptoms before or since.  She has not had any previous cardiac workup.  I was able to review some recent labs and electrolytes and TSH were within normal limits.   Past Medical History:  Diagnosis Date   Allergy    Anxiety    Dermatochalasis of right upper eyelid 01/21/2023   Diverticulosis 10/28/2008   DJD (degenerative joint disease)    Elevated liver function tests 03/16/2024   Epiretinal membrane, left 01/21/2023   Family history of colon cancer in sister and father (60's) 06/18/2013   Single adenoma 10/2013 colonoscopy     Fatty liver    Fracture of superior pubic ramus (HCC) 12/27/2021   Gait disorder    Hearing loss 03/16/2024   History of colonic polyps 10/30/2013   10/2013 - diminutive adenoma - repeat colon 2020  IMO SNOMED Dx Update Oct 2024     History of melanoma    Hyperlipidemia    Lumbar radiculopathy    Lymphocytic colitis    Melanoma in situ (HCC) 03/16/2024   Migraine without aura and responsive to treatment 03/16/2024   Obesity    Osteopenia    Personal history  of colonic polyp - adenoma 10/30/2013   Polyarthritis    PONV (postoperative nausea and vomiting)    Sciatica 03/16/2024   Trochanteric bursitis of left hip 03/30/2022    Past Surgical History:  Procedure Laterality Date   CALCANEAL OSTEOTOMY Left 09/16/2014   Procedure: CALCANEAL OSTEOTOMY;  Surgeon: Norleen Armor, MD;  Location: [facility];  Service: Orthopedics;  Laterality: Left;   COLONOSCOPY  10/28/2008   Dr. Ozell Large   FOOT SURGERY Right 2012 ALSO 2003   GASTROC RECESSION EXTREMITY Left 09/16/2014   Procedure: LEFT GASTROCNEMIUS RECESSION ;  Surgeon: Norleen Armor, MD;  Location: [facility];  Service: Orthopedics;  Laterality: Left;   JOINT REPLACEMENT Left    knee   LESION REMOVAL Right 11/19/2019   Procedure: Excision of right arm dysplasia;  Surgeon: Lowery Estefana GORMAN, DO;  Location: [facility];  Service: Plastics;  Laterality: Right;   NEVUS EXCISION Right 11/19/2019   Procedure: Excision of right foot melanoma;  Surgeon: Lowery Estefana GORMAN, DO;  Location: [facility];  Service: Plastics;  Laterality: Right;   TOTAL KNEE ARTHROPLASTY  05/14/2011   Procedure: TOTAL KNEE ARTHROPLASTY;  Surgeon: Donnice JONETTA Car, MD;  Location: [facility];  Service: Orthopedics;  Laterality: Left;     Current Outpatient Medications  Medication Sig Dispense Refill   budesonide  (ENTOCORT EC ) 3 MG 24 hr capsule Take 1 capsule (3 mg total) by mouth daily. 90 capsule 3   celecoxib (CELEBREX) 100 MG capsule Take 100 mg by mouth daily.     cetirizine (ZYRTEC) 10 MG tablet Take 10 mg by mouth daily.     Cholecalciferol (VITAMIN D-3) 5000 UNITS TABS Take by mouth daily.      Cyanocobalamin  (VITAMIN B-12) 1000 MCG SUBL Place under the tongue daily.     glucosamine-chondroitin 500-400 MG tablet Take 1 tablet by mouth daily.      Multiple Vitamins-Minerals (ICAPS PO) Take 1 tablet by mouth daily.     rizatriptan  (MAXALT ) 10 MG tablet Take 1 tablet  (10 mg total) by mouth as needed for migraine. May repeat in 2 hours if needed 10 tablet 2   rosuvastatin (CRESTOR) 5 MG tablet Take 5 mg by mouth daily.     tiZANidine (ZANAFLEX) 2 MG tablet Take 2 mg by mouth at bedtime.     topiramate (TOPAMAX) 25 MG tablet Take 25 mg by mouth 2 (two) times daily. (Patient taking differently: Take 25 mg by mouth daily.)     CALCIUM-MAGNESIUM-VITAMIN D PO Take 1 tablet by mouth 2 (two) times daily. (Patient not taking: Reported on 03/26/2024)     DULoxetine (CYMBALTA) 30 MG capsule Take 30 mg by mouth daily. (Patient not taking: Reported on 03/26/2024)     No current facility-administered medications for this visit.    Allergies:   Platinum-containing compounds; Codeine; Yellow dye #6 (sunset yellow); Clindamycin hcl; Aspirin; Nickel; Penicillins; Sulfamethoxazole -trimethoprim ; and Yellow dyes 6, 10, and 11    Social History:  The patient  reports that she has never smoked. She has never used smokeless tobacco. She reports that she does not drink alcohol and does not use drugs.   Family History:  The patient's family history includes Breast cancer in her maternal grandfather and another family member; Colon cancer (age of onset: 14) in her father; Colon cancer (age of onset: 37) in her sister; Coronary artery disease in her father; Diabetes in her paternal grandfather; Heart attack (age of onset: 102) in her father; Rheum arthritis in her mother.    ROS:  Please see the history of present illness.   Otherwise, review of systems are positive for none.   All other systems are reviewed and negative.    PHYSICAL EXAM: VS:  BP 114/68   Pulse 65   Ht 5' 3 (1.6 m)   Wt 189 lb 12.8 oz (86.1 kg)   SpO2 98%   BMI 33.62 kg/m  , BMI Body mass index is 33.62 kg/m. GENERAL:  Well appearing HEENT:  Pupils equal round and reactive, fundi not visualized, oral mucosa unremarkable NECK:  No jugular venous distention, waveform within normal limits, carotid upstroke brisk  and symmetric, no bruits, no thyromegaly LYMPHATICS:  No cervical, inguinal adenopathy LUNGS:  Clear to auscultation bilaterally BACK:  No CVA tenderness CHEST:  Unremarkable HEART:  PMI not displaced or sustained,S1 and S2 within normal limits, no S3, no S4, no clicks, no rubs, no murmurs ABD:  Flat, positive bowel sounds normal in frequency in pitch, no bruits, no rebound, no guarding, no midline pulsatile mass, no hepatomegaly, no splenomegaly EXT:  2 plus pulses throughout, no edema, no cyanosis no clubbing SKIN:  No rashes no nodules NEURO:  Cranial nerves II through XII grossly intact, motor grossly  intact throughout [facility]:  Cognitively intact, oriented to person place and time    EKG:  EKG Interpretation Date/Time:  Thursday March 26 2024 11:44:16 EST Ventricular Rate:  65 PR Interval:  140 QRS Duration:  84 QT Interval:  428 QTC Calculation: 445 R Axis:   -51  Text Interpretation: Normal sinus rhythm Left anterior fascicular block No previous ECGs available Confirmed by Lavona Rattan ([id number]) on 03/26/2024 1:12:58 PM     Recent Labs: No results found for requested labs within last 365 days.    Lipid Panel No results found for: CHOL, TRIG, HDL, CHOLHDL, VLDL, LDLCALC, LDLDIRECT    Wt Readings from Last 3 Encounters:  03/26/24 189 lb 12.8 oz (86.1 kg)  07/25/23 180 lb 6 oz (81.8 kg)  06/06/23 183 lb (83 kg)      Other studies Reviewed: Additional studies/ records that were reviewed today include: Labs. Review of the above records demonstrates:  Please see elsewhere in the note.     ASSESSMENT AND PLAN:  Palpitations: The patient had 1 episode of tachypalpitations.  She did not have any symptoms associated with this.  She otherwise feels well.  Her exam was unremarkable.  She had no significant findings on EKG.  Labs were unremarkable.  At this point in the absence of further symptoms I would not suggest further cardiovascular testing.  However, if  she has any continued tachypalpitations she would let me know.  At that point she will need event monitoring.     Current medicines are reviewed at length with the patient today.  The patient does not have concerns regarding medicines.  The following changes have been made:  no change  Labs/ tests ordered today include:   Orders Placed This Encounter  Procedures   EKG 12-Lead     Disposition:   FU with me as needed.      Signed, Lynwood Lavona, MD  03/26/2024 1:14 PM    [facility]    "

## 2024-03-26 ENCOUNTER — Ambulatory Visit: Attending: Cardiology | Admitting: Cardiology

## 2024-03-26 ENCOUNTER — Encounter: Payer: Self-pay | Admitting: Cardiology

## 2024-03-26 VITALS — BP 114/68 | HR 65 | Ht 63.0 in | Wt 189.8 lb

## 2024-03-26 DIAGNOSIS — R0602 Shortness of breath: Secondary | ICD-10-CM | POA: Diagnosis present

## 2024-03-26 NOTE — Patient Instructions (Signed)

## 2024-04-04 ENCOUNTER — Ambulatory Visit: Payer: Self-pay | Admitting: Cardiology

## 2024-04-06 ENCOUNTER — Inpatient Hospital Stay (HOSPITAL_BASED_OUTPATIENT_CLINIC_OR_DEPARTMENT_OTHER): Admission: RE | Admit: 2024-04-06 | Source: Ambulatory Visit

## 2024-04-13 ENCOUNTER — Ambulatory Visit (HOSPITAL_BASED_OUTPATIENT_CLINIC_OR_DEPARTMENT_OTHER)

## 2024-04-21 ENCOUNTER — Ambulatory Visit (HOSPITAL_BASED_OUTPATIENT_CLINIC_OR_DEPARTMENT_OTHER)
